# Patient Record
Sex: Male | Born: 1980 | Race: White | Hispanic: No | Marital: Single | Smoking: Current every day smoker
Health system: Southern US, Community
[De-identification: ages and names within clinical notes are randomized; demographics above are authoritative.]

## PROBLEM LIST (undated history)

## (undated) DIAGNOSIS — Z87438 Personal history of other diseases of male genital organs: Secondary | ICD-10-CM

## (undated) DIAGNOSIS — N289 Disorder of kidney and ureter, unspecified: Secondary | ICD-10-CM

## (undated) HISTORY — PX: HERNIA REPAIR: SHX51

## (undated) HISTORY — PX: CHOLECYSTECTOMY: SHX55

## (undated) HISTORY — PX: HAND SURGERY: SHX662

## (undated) HISTORY — DX: Personal history of other diseases of male genital organs: Z87.438

---

## 2018-01-19 ENCOUNTER — Ambulatory Visit: Payer: Self-pay | Admitting: Family Medicine

## 2019-03-04 ENCOUNTER — Emergency Department (HOSPITAL_COMMUNITY)
Admission: EM | Admit: 2019-03-04 | Discharge: 2019-03-05 | Disposition: A | Discharge status: Home / Self Care | Payer: BC Managed Care – PPO | Attending: Emergency Medicine | Admitting: Emergency Medicine

## 2019-03-04 ENCOUNTER — Encounter (HOSPITAL_COMMUNITY): Payer: Self-pay

## 2019-03-04 ENCOUNTER — Other Ambulatory Visit: Payer: Self-pay

## 2019-03-04 DIAGNOSIS — Z1159 Encounter for screening for other viral diseases: Secondary | ICD-10-CM | POA: Diagnosis not present

## 2019-03-04 DIAGNOSIS — R2243 Localized swelling, mass and lump, lower limb, bilateral: Secondary | ICD-10-CM | POA: Insufficient documentation

## 2019-03-04 DIAGNOSIS — R600 Localized edema: Secondary | ICD-10-CM

## 2019-03-04 DIAGNOSIS — R109 Unspecified abdominal pain: Secondary | ICD-10-CM | POA: Insufficient documentation

## 2019-03-04 DIAGNOSIS — R21 Rash and other nonspecific skin eruption: Secondary | ICD-10-CM | POA: Insufficient documentation

## 2019-03-04 LAB — CBC
HCT: 41.1 % (ref 39.0–52.0)
Hemoglobin: 13.9 g/dL (ref 13.0–17.0)
MCH: 31.6 pg (ref 26.0–34.0)
MCHC: 33.8 g/dL (ref 30.0–36.0)
MCV: 93.4 fL (ref 80.0–100.0)
Platelets: 268 10*3/uL (ref 150–400)
RBC: 4.4 MIL/uL (ref 4.22–5.81)
RDW: 12.3 % (ref 11.5–15.5)
WBC: 8.1 10*3/uL (ref 4.0–10.5)
nRBC: 0 % (ref 0.0–0.2)

## 2019-03-04 LAB — COMPREHENSIVE METABOLIC PANEL
ALT: 45 U/L — ABNORMAL HIGH (ref 0–44)
AST: 31 U/L (ref 15–41)
Albumin: 4.1 g/dL (ref 3.5–5.0)
Alkaline Phosphatase: 68 U/L (ref 38–126)
Anion gap: 10 (ref 5–15)
BUN: 16 mg/dL (ref 6–20)
CO2: 28 mmol/L (ref 22–32)
Calcium: 9.3 mg/dL (ref 8.9–10.3)
Chloride: 100 mmol/L (ref 98–111)
Creatinine, Ser: 1.19 mg/dL (ref 0.61–1.24)
GFR calc Af Amer: >60 mL/min (ref >60)
GFR calc non Af Amer: >60 mL/min (ref >60)
Glucose, Bld: 95 mg/dL (ref 70–99)
Potassium: 4.2 mmol/L (ref 3.5–5.1)
Sodium: 138 mmol/L (ref 135–145)
Total Bilirubin: 0.5 mg/dL (ref 0.3–1.2)
Total Protein: 6.6 g/dL (ref 6.5–8.1)

## 2019-03-04 LAB — URINALYSIS, ROUTINE W REFLEX MICROSCOPIC
Bilirubin Urine: NEGATIVE
Glucose, UA: NEGATIVE mg/dL
Hgb urine dipstick: NEGATIVE
Ketones, ur: NEGATIVE mg/dL
Leukocytes,Ua: NEGATIVE
Nitrite: NEGATIVE
Protein, ur: NEGATIVE mg/dL
Specific Gravity, Urine: 1.025 (ref 1.005–1.030)
pH: 6 (ref 5.0–8.0)

## 2019-03-04 LAB — LIPASE, BLOOD: Lipase: 30 U/L (ref 11–51)

## 2019-03-04 MED ORDER — SODIUM CHLORIDE 0.9% FLUSH: 3.0000 mL | Freq: Once | INTRAVENOUS | Status: DC

## 2019-03-04 MED ORDER — ONDANSETRON HCL 4 MG/2ML IJ SOLN
4.0000 mg | Freq: Once | INTRAMUSCULAR | Status: AC
  Administered 2019-03-05: 4 mg via INTRAVENOUS
  Filled 2019-03-04: qty 2

## 2019-03-04 NOTE — ED Triage Notes (Signed)
Pt reports bilateral leg swelling, subjective fevers and abd pain for the past 2 days. Nausea no vomiting. Pt anxious in triage

## 2019-03-04 NOTE — ED Provider Notes (Signed)
[facility] EMERGENCY DEPARTMENT Provider Note   CSN: [id number] Arrival date & time: 03/04/19  2114    History   Chief Complaint Chief Complaint  Patient presents with  . Abdominal Pain  . Leg Swelling  . Fever    HPI Taylor Ryan is a 38 y.o. male.     HPI  This is a 38 year old male with no significant past medical history who presents with 1 to 2-day history of lower extremity swelling, redness, chills, and abdominal pain.  Patient states that he first noted bilateral lower extremity swelling and discomfort of the bilateral lower extremities.  He has noted red to purple discoloration that sometimes improves with elevation.  He reports chills without documented fevers.  Denies any cough, shortness of breath, chest pain.  Denies any known exposures.  He has reported some abdominal discomfort.  Currently he is without abdominal pain.  He states that it felt bloated and pressure-like when he did have pain.  He reports normal bowel movements.  He endorses nausea without vomiting.  No known COVID risk factors.  History reviewed. No pertinent past medical history.  There are no active problems to display for this patient.   Past Surgical History:  Procedure Laterality Date  . CHOLECYSTECTOMY    . HERNIA REPAIR          Home Medications    Prior to Admission medications   Medication Sig Start Date End Date Taking? Authorizing Provider  doxycycline (VIBRAMYCIN) 100 MG capsule Take 1 capsule (100 mg total) by mouth 2 (two) times daily. 03/05/19   Madesyn Ast, Mayer Masker, MD    Family History No family history on file.  Social History Social History   Tobacco Use  . Smoking status: Not on file  Substance Use Topics  . Alcohol use: Not on file  . Drug use: Not on file     Allergies   Amoxicillin   Review of Systems Review of Systems  Constitutional: Positive for chills. Negative for fever.  Respiratory: Negative for cough and shortness of  breath.   Cardiovascular: Negative for chest pain.  Gastrointestinal: Positive for abdominal pain and nausea. Negative for diarrhea and vomiting.  Musculoskeletal: Negative for back pain.  Skin: Positive for color change. Negative for wound.  All other systems reviewed and are negative.    Physical Exam Updated Vital Signs BP 112/65   Pulse 86   Temp 98.7 F (37.1 C) (Oral)   Resp 18   SpO2 95%   Physical Exam Vitals signs and nursing note reviewed.  Constitutional:      General: He is not in acute distress.    Appearance: He is well-developed. He is not ill-appearing.  HENT:     Head: Normocephalic and atraumatic.  Neck:     Musculoskeletal: Neck supple.  Cardiovascular:     Rate and Rhythm: Normal rate and regular rhythm.     Heart sounds: Normal heart sounds. No murmur.  Pulmonary:     Effort: Pulmonary effort is normal. No respiratory distress.     Breath sounds: Normal breath sounds. No wheezing.  Abdominal:     General: Bowel sounds are normal.     Palpations: Abdomen is soft.     Tenderness: There is no abdominal tenderness. There is no rebound.  Lymphadenopathy:     Cervical: No cervical adenopathy.  Skin:    General: Skin is warm and dry.     Comments: Blanching erythema to the bilateral lower extremities to the  mid calf, warm to touch, scattered petechiae that is nonblanching, 2+ DP pulse laterally  Neurological:     Mental Status: He is alert and oriented to person, place, and time.  Psychiatric:        Mood and Affect: Mood normal.      ED Treatments / Results  Labs (all labs ordered are listed, but only abnormal results are displayed) Labs Reviewed  COMPREHENSIVE METABOLIC PANEL - Abnormal; Notable for the following components:      Result Value   ALT 45 (*)    All other components within normal limits  SARS CORONAVIRUS 2 (HOSPITAL ORDER, PERFORMED IN [facility])  LIPASE, BLOOD  CBC  URINALYSIS, ROUTINE W REFLEX MICROSCOPIC   PROTIME-INR    EKG None  Radiology No results found.  Procedures Procedures (including critical care time)  Medications Ordered in ED Medications  sodium chloride flush (NS) 0.9 % injection 3 mL (3 mLs Intravenous Not Given 03/05/19 0037)  ondansetron (ZOFRAN) injection 4 mg (4 mg Intravenous Given 03/05/19 0000)     Initial Impression / Assessment and Plan / ED Course  I have reviewed the triage vital signs and the nursing notes.  Pertinent labs & imaging results that were available during my care of the patient were reviewed by me and considered in my medical decision making (see chart for details).        Patient presents with lower extremity swelling, erythema and abdominal pain that has resolved.  He is overall nontoxic and afebrile on exam.  Vital signs are reassuring.  He has erythema and warmth of the bilateral lower extremities with scattered petechiae.  It does not involve the palms and the soles.  He reports purple discoloration of his extremities.  He has strong peripheral pulses which would argue against ischemic etiology.  His lab work-up is reassuring including CBC, CMP, lipase, urinalysis.  No metabolic derangements.  I did add on a COVID-19 as this has been known to have some vasculitic and prothrombotic component.  I did discuss with the patient any possible tick exposures which he denies but does have a dog and [place].  COVID is neg. patient has remained clinically stable.  Will treat for possible tickborne illness versus cellulitis.  He was given strict return precautions.  He has not had any recurrence of his abdominal discomfort.  Taylor LongsJoseph Dever was evaluated in Emergency Department on 03/05/2019 for the symptoms described in the history of present illness. He was evaluated in the context of the global COVID-19 pandemic, which necessitated consideration that the patient might be at risk for infection with the SARS-CoV-2 virus that  causes COVID-19. Institutional protocols and algorithms that pertain to the evaluation of patients at risk for COVID-19 are in a state of rapid change based on information released by regulatory bodies including the CDC and federal and state organizations. These policies and algorithms were followed during the patient's care in the ED.   Final Clinical Impressions(s) / ED Diagnoses   Final diagnoses:  Lower extremity edema  Rash    ED Discharge Orders         Ordered    doxycycline (VIBRAMYCIN) 100 MG capsule  2 times daily     03/05/19 0133           Deklin Bieler, Mayer Maskerourtney F, MD 03/05/19 [phone]

## 2019-03-05 LAB — PROTIME-INR
INR: 1 (ref 0.8–1.2)
Prothrombin Time: 13.3 seconds (ref 11.4–15.2)

## 2019-03-05 LAB — SARS CORONAVIRUS 2 BY RT PCR (HOSPITAL ORDER, PERFORMED IN ~~LOC~~ HOSPITAL LAB): SARS Coronavirus 2: NEGATIVE

## 2019-03-05 MED ORDER — DOXYCYCLINE HYCLATE 100 MG PO CAPS: 100.0000 mg | ORAL_CAPSULE | Freq: Two times a day (BID) | ORAL | 0 refills | Status: DC

## 2019-03-05 NOTE — Discharge Instructions (Addendum)
You were seen today for lower extremity swelling and redness.  Your work-up is largely reassuring.  The exact cause of your symptoms is unknown at this time.  However, you will be treated for possible skin infection.  If you have progression of redness, worsening swelling, or note increased rash, you need to be reevaluated immediately.

## 2019-03-13 ENCOUNTER — Ambulatory Visit (INDEPENDENT_AMBULATORY_CARE_PROVIDER_SITE_OTHER): Payer: BC Managed Care – PPO | Admitting: Family Medicine

## 2019-03-13 ENCOUNTER — Other Ambulatory Visit: Payer: Self-pay

## 2019-03-13 ENCOUNTER — Encounter: Payer: Self-pay | Admitting: Family Medicine

## 2019-03-13 VITALS — Wt 190.0 lb

## 2019-03-13 DIAGNOSIS — R208 Other disturbances of skin sensation: Secondary | ICD-10-CM | POA: Diagnosis not present

## 2019-03-13 DIAGNOSIS — R202 Paresthesia of skin: Secondary | ICD-10-CM

## 2019-03-13 DIAGNOSIS — Z7689 Persons encountering health services in other specified circumstances: Secondary | ICD-10-CM | POA: Diagnosis not present

## 2019-03-13 DIAGNOSIS — L819 Disorder of pigmentation, unspecified: Secondary | ICD-10-CM

## 2019-03-13 DIAGNOSIS — R740 Nonspecific elevation of levels of transaminase and lactic acid dehydrogenase [LDH]: Secondary | ICD-10-CM

## 2019-03-13 DIAGNOSIS — M791 Myalgia, unspecified site: Secondary | ICD-10-CM

## 2019-03-13 DIAGNOSIS — Z8349 Family history of other endocrine, nutritional and metabolic diseases: Secondary | ICD-10-CM | POA: Insufficient documentation

## 2019-03-13 DIAGNOSIS — Z87438 Personal history of other diseases of male genital organs: Secondary | ICD-10-CM | POA: Insufficient documentation

## 2019-03-13 DIAGNOSIS — R600 Localized edema: Secondary | ICD-10-CM

## 2019-03-13 DIAGNOSIS — Z7251 High risk heterosexual behavior: Secondary | ICD-10-CM | POA: Insufficient documentation

## 2019-03-13 DIAGNOSIS — R7401 Elevation of levels of liver transaminase levels: Secondary | ICD-10-CM

## 2019-03-13 NOTE — Progress Notes (Signed)
Subjective:   Documentation for virtual audio and video telecommunications through [employer] encounter:  The patient was located at home. 2 patient identifiers used.  The provider was located in the office. The patient did consent to this visit and is aware of possible charges through their insurance for this visit.  The other persons participating in this telemedicine service were none.     Patient ID: Taylor Ryan, male    DOB: [birth year], 38 y.o.   MRN: [id number]  HPI Chief Complaint  Patient presents with  . new pt    new pt established, legs- turning purple/blue near feet and then turn red up legs and inflammed. feel hot, tingling, no other concerns   He is a new patient. I am seeing him today virtually due to recent ED visit for suspicious Covid-19 symptoms.  Previous medical care: [facility] [place]  Complains of an 11-12 day history of an acute onset of bilateral LE edema, red-purplish discoloration of feet, ankles and lower legs almost up to his knees. States he also has tingling, burning and pain when he is on his feet. Pain resolves with rest. States when he elevates his legs, the color improves and swelling also improves but not back to baseline. Denies numbness or weakness.  Denies rash to palms or soles of feet. No other arthralgias or myalgias.   Denies unilateral calf pain or swelling. No history of DVT or PE.   He was seen in the ED for these symptoms on 03/04/19 and was treated with prophylactic Doxycycline for possible tickborne illness. States he is still taking this. Denies seeing a tick or possible bite but he is outdoors often and has a dog.   ED checked las and they were unremarkable. He did have a mildly elevated ALT.   Denies fever, chills, dizziness, headache, neck pain or stiffness, sore throat, URI symptoms, chest pain, palpitations, shortness of breath, cough, orthopnea, abdominal pain, N/V/D, urinary symptoms.   Denies family history of lupus, RA.  Sister has thyroid disease.   States he has issues with anxiety - takes Klonapin prn, states not often   Smoker- 1 ppd since age 38 Rarely drinks alcohol Smokes marijuana occasionally   Lives with boyfriend Working as a Runner, broadcasting/film/videoteacher. Teaches biology. [employer]   CBC Latest Ref Rng & Units 03/04/2019  WBC 4.0 - 10.5 K/uL 8.1  Hemoglobin 13.0 - 17.0 g/dL 40.913.9  Hematocrit 81.139.0 - 52.0 % 41.1  Platelets 150 - 400 K/uL 268   CMP Latest Ref Rng & Units 03/04/2019  Glucose 70 - 99 mg/dL 95  BUN 6 - 20 mg/dL 16  Creatinine 9.140.61 - 7.821.24 mg/dL 9.561.19  Sodium 213135 - 086145 mmol/L 138  Potassium 3.5 - 5.1 mmol/L 4.2  Chloride 98 - 111 mmol/L 100  CO2 22 - 32 mmol/L 28  Calcium 8.9 - 10.3 mg/dL 9.3  Total Protein 6.5 - 8.1 g/dL 6.6  Total Bilirubin 0.3 - 1.2 mg/dL 0.5  Alkaline Phos 38 - 126 U/L 68  AST 15 - 41 U/L 31  ALT 0 - 44 U/L 45(H)   Reviewed allergies, medications, past medical, surgical, family, and social history.   Review of Systems Pertinent positives and negatives in the history of present illness.     Objective:   Physical Exam Wt 190 lb (86.2 kg)   BMI 27.26 kg/m   Alert and oriented and in no acute distress.  Speaking in complete sentences without difficulty.  Respirations are unlabored.  Attempted to visualize lower extremities however this was difficult to do with the patient's self on but I was able to visualize edema to bilateral feet along with erythema.  Normal speech, mood and thought process.      Assessment & Plan:  Bilateral leg edema - Plan: CBC with Differential/Platelet, Comprehensive metabolic panel, Sedimentation rate, TSH, Lupus anticoagulant panel, C-reactive protein  Discoloration of skin of foot - Plan: CBC with Differential/Platelet, Comprehensive metabolic panel, RPR, Sedimentation rate, TSH, Lupus anticoagulant panel, C-reactive protein  Discoloration of skin of lower leg - Plan: RPR, Sedimentation rate, TSH, Lupus anticoagulant  panel, C-reactive protein  Encounter to establish care  Burning sensation of feet - Plan: RPR, Sedimentation rate, HIV Antibody (routine testing w rflx), Lupus anticoagulant panel, C-reactive protein  Elevated ALT measurement - Plan: Comprehensive metabolic panel, Acute Hep Panel & Hep B Surface Ab  Family history of thyroid disease in sister - Plan: TSH  Tingling of both feet - Plan: Vitamin B12, HIV Antibody (routine testing w rflx), Lupus anticoagulant panel, C-reactive protein  High risk sexual behavior, unspecified type - Plan: RPR, HIV Antibody (routine testing w rflx)  Myalgia - Plan: Sedimentation rate, Lupus anticoagulant panel, C-reactive protein, CK  Discussed limitations of virtual visit. He is new to me and we spent a significant amount of time discussing PMH, SH, family history, social history as well as his chief complaint of lower extremity edema and discoloration. I reviewed recent ED notes including lab results.  He was COVID-19 negative. I also reviewed previous PCP notes. He does not appear to be in any acute distress.  Discussed possible etiologies. Unlikely that this is cellulitis since it resolves somewhat when he elevates his legs.  Also unlikely this is a DVT or ischemia since this is bilateral and per ED exam he had strong peripheral pulses.  Will check labs to look for underlying collagen/vascular anomalies.  He will complete the doxycycline that was prescribed by the ED physician. He does report high risk sexual behavior-MSM We will check labs and follow-up. He will come into the office tomorrow for labs and vitals.  We did discuss that I am not interested in prescribing Klonopin for his anxiety and that I recommend counseling. He is aware.  Answered all of his questions and he is pleased with plan of care  Time spent on call was 30 minutes and in review of previous records 5 minutes total.  This virtual service is not related to other E/M service within  previous 7 days.

## 2019-03-14 ENCOUNTER — Other Ambulatory Visit: Payer: BC Managed Care – PPO

## 2019-03-14 DIAGNOSIS — Z7251 High risk heterosexual behavior: Secondary | ICD-10-CM

## 2019-03-14 DIAGNOSIS — Z8349 Family history of other endocrine, nutritional and metabolic diseases: Secondary | ICD-10-CM

## 2019-03-14 DIAGNOSIS — R208 Other disturbances of skin sensation: Secondary | ICD-10-CM

## 2019-03-14 DIAGNOSIS — M791 Myalgia, unspecified site: Secondary | ICD-10-CM

## 2019-03-14 DIAGNOSIS — R600 Localized edema: Secondary | ICD-10-CM

## 2019-03-14 DIAGNOSIS — R7401 Elevation of levels of liver transaminase levels: Secondary | ICD-10-CM

## 2019-03-14 DIAGNOSIS — L819 Disorder of pigmentation, unspecified: Secondary | ICD-10-CM

## 2019-03-14 DIAGNOSIS — R202 Paresthesia of skin: Secondary | ICD-10-CM

## 2019-03-15 LAB — CBC WITH DIFFERENTIAL/PLATELET
Basophils Absolute: 0.1 10*3/uL (ref 0.0–0.2)
Basos: 1 %
EOS (ABSOLUTE): 0.3 10*3/uL (ref 0.0–0.4)
Eos: 4 %
Hematocrit: 41.4 % (ref 37.5–51.0)
Hemoglobin: 14.3 g/dL (ref 13.0–17.7)
Immature Grans (Abs): 0 10*3/uL (ref 0.0–0.1)
Immature Granulocytes: 0 %
Lymphocytes Absolute: 2.4 10*3/uL (ref 0.7–3.1)
Lymphs: 37 %
MCH: 32.1 pg (ref 26.6–33.0)
MCHC: 34.5 g/dL (ref 31.5–35.7)
MCV: 93 fL (ref 79–97)
Monocytes Absolute: 0.5 10*3/uL (ref 0.1–0.9)
Monocytes: 8 %
Neutrophils Absolute: 3.2 10*3/uL (ref 1.4–7.0)
Neutrophils: 50 %
Platelets: 256 10*3/uL (ref 150–450)
RBC: 4.45 x10E6/uL (ref 4.14–5.80)
RDW: 12.9 % (ref 11.6–15.4)
WBC: 6.5 10*3/uL (ref 3.4–10.8)

## 2019-03-15 LAB — COMPREHENSIVE METABOLIC PANEL
ALT: 24 IU/L (ref 0–44)
AST: 19 IU/L (ref 0–40)
Albumin/Globulin Ratio: 2.2 (ref 1.2–2.2)
Albumin: 4.4 g/dL (ref 4.0–5.0)
Alkaline Phosphatase: 61 IU/L (ref 39–117)
BUN/Creatinine Ratio: 15 (ref 9–20)
BUN: 15 mg/dL (ref 6–20)
Bilirubin Total: 0.5 mg/dL (ref 0.0–1.2)
CO2: 24 mmol/L (ref 20–29)
Calcium: 9.3 mg/dL (ref 8.7–10.2)
Chloride: 101 mmol/L (ref 96–106)
Creatinine, Ser: 0.98 mg/dL (ref 0.76–1.27)
GFR calc Af Amer: 112 mL/min/{1.73_m2} (ref >59)
GFR calc non Af Amer: 97 mL/min/{1.73_m2} (ref >59)
Globulin, Total: 2 g/dL (ref 1.5–4.5)
Glucose: 75 mg/dL (ref 65–99)
Potassium: 4.1 mmol/L (ref 3.5–5.2)
Sodium: 140 mmol/L (ref 134–144)
Total Protein: 6.4 g/dL (ref 6.0–8.5)

## 2019-03-15 LAB — LUPUS ANTICOAGULANT PANEL
Dilute Viper Venom Time: 32.7 s (ref 0.0–47.0)
PTT Lupus Anticoagulant: 34 s (ref 0.0–51.9)

## 2019-03-15 LAB — SEDIMENTATION RATE: Sed Rate: 2 mm/hr (ref 0–15)

## 2019-03-15 LAB — ACUTE HEP PANEL AND HEP B SURFACE AB
Hep A IgM: NEGATIVE
Hep B C IgM: NEGATIVE
Hep C Virus Ab: <0.1 s/co ratio (ref 0.0–0.9)
Hepatitis B Surf Ab Quant: <3.1 m[IU]/mL — ABNORMAL LOW (ref >9.9)
Hepatitis B Surface Ag: NEGATIVE

## 2019-03-15 LAB — RPR: RPR Ser Ql: NONREACTIVE

## 2019-03-15 LAB — C-REACTIVE PROTEIN: CRP: <1 mg/L (ref 0–10)

## 2019-03-15 LAB — TSH: TSH: 2.99 u[IU]/mL (ref 0.450–4.500)

## 2019-03-15 LAB — CK: Total CK: 108 U/L (ref 49–439)

## 2019-03-15 LAB — VITAMIN B12: Vitamin B-12: 502 pg/mL (ref 232–1245)

## 2019-03-15 LAB — HIV ANTIBODY (ROUTINE TESTING W REFLEX): HIV Screen 4th Generation wRfx: NONREACTIVE

## 2019-03-23 ENCOUNTER — Encounter: Payer: Self-pay | Admitting: Family Medicine

## 2019-03-26 ENCOUNTER — Telehealth: Payer: Self-pay | Admitting: Family Medicine

## 2019-03-26 NOTE — Telephone Encounter (Signed)
Please call pt , per caller ? Tim (show was not the pt) The patients symptoms have progressed And "he is too stubborn to call"   Did advise caller that we would have to speak to pt  ( No DPR in system)

## 2019-03-26 NOTE — Telephone Encounter (Signed)
Please call and get more information. Thanks.

## 2019-03-26 NOTE — Telephone Encounter (Signed)
Tried to call pt but number not going through. I sent pt a mychart message

## 2019-03-27 NOTE — Telephone Encounter (Signed)
Tried to call pt but unable to reach him.

## 2019-03-28 NOTE — Telephone Encounter (Signed)
Phone number listed for pt will not go through. Ive sent him a Clinical cytogeneticist message to reply

## 2019-07-30 ENCOUNTER — Encounter (HOSPITAL_COMMUNITY): Payer: Self-pay

## 2019-07-30 ENCOUNTER — Other Ambulatory Visit: Payer: Self-pay

## 2019-07-30 ENCOUNTER — Emergency Department (HOSPITAL_COMMUNITY)
Admission: EM | Admit: 2019-07-30 | Discharge: 2019-07-30 | Disposition: A | Discharge status: Home / Self Care | Payer: BC Managed Care – PPO | Attending: Emergency Medicine | Admitting: Emergency Medicine

## 2019-07-30 ENCOUNTER — Emergency Department (HOSPITAL_COMMUNITY): Payer: BC Managed Care – PPO

## 2019-07-30 DIAGNOSIS — F17210 Nicotine dependence, cigarettes, uncomplicated: Secondary | ICD-10-CM | POA: Insufficient documentation

## 2019-07-30 DIAGNOSIS — N5089 Other specified disorders of the male genital organs: Secondary | ICD-10-CM | POA: Diagnosis not present

## 2019-07-30 DIAGNOSIS — N50812 Left testicular pain: Secondary | ICD-10-CM

## 2019-07-30 DIAGNOSIS — R109 Unspecified abdominal pain: Secondary | ICD-10-CM | POA: Insufficient documentation

## 2019-07-30 HISTORY — DX: Disorder of kidney and ureter, unspecified: N28.9

## 2019-07-30 LAB — URINALYSIS, ROUTINE W REFLEX MICROSCOPIC
Bacteria, UA: NONE SEEN
Bilirubin Urine: NEGATIVE
Glucose, UA: NEGATIVE mg/dL
Ketones, ur: NEGATIVE mg/dL
Leukocytes,Ua: NEGATIVE
Nitrite: NEGATIVE
Protein, ur: NEGATIVE mg/dL
Specific Gravity, Urine: 1.016 (ref 1.005–1.030)
pH: 6 (ref 5.0–8.0)

## 2019-07-30 LAB — CBC WITH DIFFERENTIAL/PLATELET
Abs Immature Granulocytes: 0.06 10*3/uL (ref 0.00–0.07)
Basophils Absolute: 0.1 10*3/uL (ref 0.0–0.1)
Basophils Relative: 1 %
Eosinophils Absolute: 0.1 10*3/uL (ref 0.0–0.5)
Eosinophils Relative: 1 %
HCT: 45.7 % (ref 39.0–52.0)
Hemoglobin: 15.4 g/dL (ref 13.0–17.0)
Immature Granulocytes: 1 %
Lymphocytes Relative: 19 %
Lymphs Abs: 2.4 10*3/uL (ref 0.7–4.0)
MCH: 31.5 pg (ref 26.0–34.0)
MCHC: 33.7 g/dL (ref 30.0–36.0)
MCV: 93.5 fL (ref 80.0–100.0)
Monocytes Absolute: 0.8 10*3/uL (ref 0.1–1.0)
Monocytes Relative: 6 %
Neutro Abs: 9.3 10*3/uL — ABNORMAL HIGH (ref 1.7–7.7)
Neutrophils Relative %: 72 %
Platelets: 229 10*3/uL (ref 150–400)
RBC: 4.89 MIL/uL (ref 4.22–5.81)
RDW: 12.3 % (ref 11.5–15.5)
WBC: 12.8 10*3/uL — ABNORMAL HIGH (ref 4.0–10.5)
nRBC: 0 % (ref 0.0–0.2)

## 2019-07-30 LAB — LIPASE, BLOOD: Lipase: 20 U/L (ref 11–51)

## 2019-07-30 LAB — COMPREHENSIVE METABOLIC PANEL
ALT: 68 U/L — ABNORMAL HIGH (ref 0–44)
AST: 25 U/L (ref 15–41)
Albumin: 4.3 g/dL (ref 3.5–5.0)
Alkaline Phosphatase: 72 U/L (ref 38–126)
Anion gap: 7 (ref 5–15)
BUN: 8 mg/dL (ref 6–20)
CO2: 26 mmol/L (ref 22–32)
Calcium: 8.8 mg/dL — ABNORMAL LOW (ref 8.9–10.3)
Chloride: 103 mmol/L (ref 98–111)
Creatinine, Ser: 0.99 mg/dL (ref 0.61–1.24)
GFR calc Af Amer: >60 mL/min (ref >60)
GFR calc non Af Amer: >60 mL/min (ref >60)
Glucose, Bld: 96 mg/dL (ref 70–99)
Potassium: 3.9 mmol/L (ref 3.5–5.1)
Sodium: 136 mmol/L (ref 135–145)
Total Bilirubin: 0.9 mg/dL (ref 0.3–1.2)
Total Protein: 6.9 g/dL (ref 6.5–8.1)

## 2019-07-30 MED ORDER — KETOROLAC TROMETHAMINE 30 MG/ML IJ SOLN
30.0000 mg | Freq: Once | INTRAMUSCULAR | Status: AC
  Administered 2019-07-30: 12:00:00 30 mg via INTRAMUSCULAR
  Filled 2019-07-30: qty 1

## 2019-07-30 NOTE — Discharge Instructions (Signed)
Your testicular ultrasound showed a mass of some type today - please follow up with [facility]. They are aware of you and are trying to schedule an appointment as soon as possible. I recommend calling them to ensure close follow up.  Your CT scan did not show any signs of kidney stone today. The remainder of your blood work was reassuring.  Take 800 mg Ibuprofen every 8 hours as needed for the pain Please follow up with your PCP as well

## 2019-07-30 NOTE — ED Provider Notes (Signed)
[facility]-EMERGENCY DEPT Provider Note   CSN: [id number] Arrival date & time: 07/30/19  7829     History   Chief Complaint Chief Complaint  Patient presents with   Flank Pain    HPI Taylor Ryan is a 38 y.o. male with PMHx prostatitis who presents to the ED today complaining of gradual onset, constant, sharp, 10/10, left flank pain radiating into left testicle that began yesterday.  Patient reports history of kidney stones and states this feels similar.  He is also complaining of some difficulty voiding.  He states he has been able to void but states it is smaller amount than normal.  He has not noticed any blood in his urine.  Took some Tylenol with mild relief.  He had his kidney stone 17 years ago he was able to pass it on his own.  Patient also has a history of prostatitis but states this feels very different.  Denies fever, chills, nausea, vomiting, penile pain, penile discharge, any other associated symptoms.        Past Medical History:  Diagnosis Date   History of prostatitis    Renal disorder     Patient Active Problem List   Diagnosis Date Noted   Family history of thyroid disease in sister 03/13/2019   High risk sexual behavior 03/13/2019   Bilateral leg edema 03/13/2019   History of prostatitis     Past Surgical History:  Procedure Laterality Date   CHOLECYSTECTOMY     HAND SURGERY Right    HERNIA REPAIR          Home Medications    Prior to Admission medications   Medication Sig Start Date End Date Taking? Authorizing Provider  acetaminophen (TYLENOL) 325 MG tablet Take 650 mg by mouth every 6 (six) hours as needed for mild pain or headache.   Yes [provider]  doxycycline (VIBRAMYCIN) 100 MG capsule Take 1 capsule (100 mg total) by mouth 2 (two) times daily. Patient not taking: Reported on 07/30/2019 03/05/19   Horton, Mayer Masker, MD    Family History Family History  Problem Relation Age of Onset    Hypertension Mother    Heart failure Father    Hypertension Father    Thyroid disease Sister     Social History Social History   Tobacco Use   Smoking status: Current Every Day Smoker    Packs/day: 1.00    Years: 22.00    Pack years: 22.00    Types: Cigarettes   Smokeless tobacco: Never Used  Substance Use Topics   Alcohol use: Never    Frequency: Never   Drug use: Never     Allergies   Amoxicillin   Review of Systems Review of Systems  Constitutional: Negative for chills and fever.  HENT: Negative for congestion.   Eyes: Negative for visual disturbance.  Respiratory: Negative for cough and shortness of breath.   Cardiovascular: Negative for chest pain.  Gastrointestinal: Positive for abdominal pain. Negative for constipation, diarrhea, nausea and vomiting.  Genitourinary: Positive for difficulty urinating, flank pain and testicular pain. Negative for discharge, dysuria, frequency, penile pain, penile swelling and scrotal swelling.  Musculoskeletal: Negative for myalgias.  Skin: Negative for rash.  Neurological: Negative for headaches.     Physical Exam Updated Vital Signs BP 121/84 (BP Location: Left Arm)    Pulse 97    Temp 98.8 F (37.1 C) (Oral)    Resp 20    Ht  (1.778 m)  Wt 95.3 kg    SpO2 100%    BMI 30.13 kg/m   Physical Exam Vitals signs and nursing note reviewed.  Constitutional:      Appearance: He is ill-appearing.     Comments: Uncomfortable appearing male  HENT:     Head: Normocephalic and atraumatic.  Eyes:     Conjunctiva/sclera: Conjunctivae normal.  Neck:     Musculoskeletal: Neck supple.  Cardiovascular:     Rate and Rhythm: Normal rate and regular rhythm.     Pulses: Normal pulses.  Pulmonary:     Effort: Pulmonary effort is normal.     Breath sounds: Normal breath sounds. No wheezing or rales.  Chest:     Chest wall: No tenderness.  Abdominal:     Palpations: Abdomen is soft.     Tenderness: There is abdominal  tenderness. There is left CVA tenderness. There is no right CVA tenderness, guarding or rebound.     Comments: Soft, + LLQ tenderness to palpation, +BS throughout, no r/g/r, neg murphy's, neg mcburney's, + left CVA TTP  Genitourinary:    Comments: Chaperone present for exam Circumcised penis without phimosis/paraphimosis, hypospadias, erythema, tenderness, or discharge. No rashes or lesions. Left testis without erythema or edema; + TPP, normal cremasterics reflex present bilaterally. No abnormal lie. No inguinal hernias or adenopathy present. Skin:    General: Skin is warm and dry.  Neurological:     Mental Status: He is alert.      ED Treatments / Results  Labs (all labs ordered are listed, but only abnormal results are displayed) Labs Reviewed  URINALYSIS, ROUTINE W REFLEX MICROSCOPIC - Abnormal; Notable for the following components:      Result Value   Hgb urine dipstick MODERATE (*)    All other components within normal limits  COMPREHENSIVE METABOLIC PANEL - Abnormal; Notable for the following components:   Calcium 8.8 (*)    ALT 68 (*)    All other components within normal limits  CBC WITH DIFFERENTIAL/PLATELET - Abnormal; Notable for the following components:   WBC 12.8 (*)    Neutro Abs 9.3 (*)    All other components within normal limits  LIPASE, BLOOD    EKG None  Radiology Ct Renal Stone Study  Result Date: 07/30/2019 CLINICAL DATA:  Flank pain.  Evaluate for kidney stones. EXAM: CT ABDOMEN AND PELVIS WITHOUT CONTRAST TECHNIQUE: Multidetector CT imaging of the abdomen and pelvis was performed following the standard protocol without IV contrast. COMPARISON:  None. FINDINGS: Lower chest: No acute abnormality. Hepatobiliary: No focal liver abnormality identified. Previous cholecystectomy. Increase caliber of the common bile duct is noted measuring up to 1.2 cm. Pancreas: Unremarkable. No pancreatic ductal dilatation or surrounding inflammatory changes. Spleen: Normal in  size without focal abnormality. Adrenals/Urinary Tract: Normal adrenal glands. No kidney stones identified bilaterally. No hydronephrosis or hydroureter. No ureteral calculi. Urinary bladder appears normal. Stomach/Bowel: Stomach appears normal. The small bowel loops have a normal course and caliber. The appendix is visualized and appears normal. A moderate stool burden is noted throughout the colon. No abnormal bowel wall thickening, inflammation or distension. Vascular/Lymphatic: Aortic atherosclerosis. No aneurysm. No abdominopelvic adenopathy identified. Reproductive: Prostate is unremarkable. Other: Bilateral inguinal hernias containing fat only are identified, right greater than left. There is a small umbilical hernia which contains fat only, image 50/2. Previous herniorrhaphy within the supraumbilical, midline ventral abdominal wall noted. Laxity of the hernia mesh is identified along the undersurface of the ventral abdominal wall. Musculoskeletal: No acute or  significant osseous findings. IMPRESSION: 1. No acute findings. No evidence for nephrolithiasis or hydronephrosis. 2. Moderate stool burden identified within the colon 3. Bilateral fat contained inguinal hernias and fat containing umbilical hernia. Electronically Signed   By: Signa Kellaylor  Stroud M.D.   On: 07/30/2019 11:57   [place] Scrotum W/doppler  Result Date: 07/30/2019 CLINICAL DATA:  Left testicular pain. EXAM: SCROTAL ULTRASOUND DOPPLER ULTRASOUND OF THE TESTICLES TECHNIQUE: Complete ultrasound examination of the testicles, epididymis, and other scrotal structures was performed. Color and spectral Doppler ultrasound were also utilized to evaluate blood flow to the testicles. COMPARISON:  None. FINDINGS: Right testicle Measurements: 4.1 x 3.0 x 1.9 cm. No mass or microlithiasis visualized. Left testicle Measurements: 4.5 x 2.9 x 2.7 cm. 2.5 x 2.2 x 2.0 cm heterogeneous abnormality is noted in the left testicle concerning for neoplasm or malignancy.  Right epididymis:  Normal in size and appearance. Left epididymis: Left epididymis is mildly enlarged with probable 5 mm cyst. Hydrocele:  Small left hydrocele is noted. Varicocele:  None visualized. Pulsed Doppler interrogation of both testes demonstrates normal low resistance arterial and venous waveforms bilaterally. IMPRESSION: 2.5 cm heterogeneous abnormality is noted in the left testicle concerning for possible neoplasm or malignancy. Consultation with urology is recommended. Mild left epididymal enlargement is noted of uncertain etiology, 5 mm left epididymal cyst is noted. Small left hydrocele is noted. There is no Doppler evidence of testicular torsion. Electronically Signed   By: Lupita RaiderJames  Green Jr M.D.   On: 07/30/2019 12:27    Procedures Procedures (including critical care time)  Medications Ordered in ED Medications  ketorolac (TORADOL) 30 MG/ML injection 30 mg (30 mg Intramuscular Given 07/30/19 1151)     Initial Impression / Assessment and Plan / ED Course  I have reviewed the triage vital signs and the nursing notes.  Pertinent labs & imaging results that were available during my care of the patient were reviewed by me and considered in my medical decision making (see chart for details).  Clinical Course as of Jul 29 1399  Tue Jul 30, 2019  1241 Discussed case with Dr. Berneice HeinrichManny with urology - reccs outpatient F/U.    [MV]    Clinical Course User Index [MV] Tanda RockersVenter, Tomia Enlow, PA-C   38 year old male who presents the ED complaining of left flank pain radiating into her left lower quadrant and left testes.  History of kidney stones and states this feels similar.  Patient appears very uncomfortable on exam is writhing in bed with what appears to be colicky type pain.  His vital signs are stable today.  Patient is afebrile without tachycardia or tachypnea.  He does have left CVA tenderness on exam as well as left lower quadrant tenderness and left testicular tenderness.  Will obtain  baseline blood work and obtain CT renal stone study.Testicular ultrasound ordered as well to rule out torsion given significant TTP to left testicle. Toradol given for pain.  Bladder scan with 150 mLs   Leukocytosis present at 12.8. No electrolyte abnormalities today. Lipase negative. CT renal study negative for kidney stones. Does show some fat containing hernias but no evidence of incarceration or strangulation.   Testicular ultrasound with concern for malignancy. Will consult Dr. Berneice Heinrichmanny with urology for further reccs   Pt to follow up with urology. They are attempting to get him an appt ASAP but recommend he call as well to ensure follow up. Patient may be having pain from potential small bleed from the mass in his testicle.   Upon  reevaluation patient resting more comfortably than when he entered the ED. I have discussed findings of ultrasound with him and answered all of his questions. He understands that he needs to follow up with urology for further management/potential surgery in the near future. Still awaiting urine at this time - if no acute findings on urine will discharge home.   Urinalysis with small amount of Hgb on dipstick; none seen microscopically. No infection. Will discharge patient home at this time with close urology follow up.   This note was prepared using Dragon voice recognition software and may include unintentional dictation errors due to the inherent limitations of voice recognition software.       Final Clinical Impressions(s) / ED Diagnoses   Final diagnoses:  Pain in left testicle  Testicular mass    ED Discharge Orders    None       Tanda Rockers, PA-C 07/30/19 1401    Milagros Loll, MD 07/31/19 [phone]

## 2019-07-30 NOTE — ED Triage Notes (Signed)
Patient c/o left flank pain since yesterday AM. Patient states when he stands it feels like someone is "yanking on his testicles." Patient states he is voiding small amounts.

## 2019-08-06 ENCOUNTER — Other Ambulatory Visit: Payer: Self-pay

## 2019-08-06 ENCOUNTER — Inpatient Hospital Stay: Payer: BC Managed Care – PPO | Admitting: Family Medicine

## 2019-11-25 ENCOUNTER — Other Ambulatory Visit: Payer: Self-pay | Admitting: Urology

## 2019-11-25 DIAGNOSIS — N509 Disorder of male genital organs, unspecified: Secondary | ICD-10-CM

## 2019-11-26 ENCOUNTER — Telehealth: Payer: Self-pay | Admitting: Family Medicine

## 2019-11-26 NOTE — Telephone Encounter (Signed)
Pt dropped off FMLA paperwork to be filled out put in your folder

## 2019-11-26 NOTE — Telephone Encounter (Signed)
Please find out what this is about. I have not seen him in several months.

## 2019-11-26 NOTE — Telephone Encounter (Signed)
Pt is coming in Thursday to fill the paperwork out

## 2019-11-26 NOTE — Telephone Encounter (Signed)
Number listed is not a working number. Looks like pt is scheduled for Thursday to discuss papers

## 2019-11-28 ENCOUNTER — Ambulatory Visit
Admission: RE | Admit: 2019-11-28 | Discharge: 2019-11-28 | Disposition: A | Discharge status: Home / Self Care | Payer: BC Managed Care – PPO | Source: Ambulatory Visit | Attending: Urology | Admitting: Urology

## 2019-11-28 ENCOUNTER — Encounter: Payer: Self-pay | Admitting: Family Medicine

## 2019-11-28 ENCOUNTER — Ambulatory Visit: Payer: BC Managed Care – PPO | Admitting: Family Medicine

## 2019-11-28 ENCOUNTER — Other Ambulatory Visit: Payer: Self-pay

## 2019-11-28 VITALS — BP 120/70 | HR 81 | Temp 97.7°F | Wt 250.0 lb

## 2019-11-28 DIAGNOSIS — N5089 Other specified disorders of the male genital organs: Secondary | ICD-10-CM | POA: Diagnosis not present

## 2019-11-28 DIAGNOSIS — F1520 Other stimulant dependence, uncomplicated: Secondary | ICD-10-CM | POA: Diagnosis not present

## 2019-11-28 DIAGNOSIS — R7401 Elevation of levels of liver transaminase levels: Secondary | ICD-10-CM

## 2019-11-28 DIAGNOSIS — F419 Anxiety disorder, unspecified: Secondary | ICD-10-CM | POA: Diagnosis not present

## 2019-11-28 DIAGNOSIS — N509 Disorder of male genital organs, unspecified: Secondary | ICD-10-CM

## 2019-11-28 LAB — COMPREHENSIVE METABOLIC PANEL
ALT: 31 IU/L (ref 0–44)
AST: 24 IU/L (ref 0–40)
Albumin/Globulin Ratio: 1.9 (ref 1.2–2.2)
Albumin: 4.3 g/dL (ref 4.0–5.0)
Alkaline Phosphatase: 61 IU/L (ref 39–117)
BUN/Creatinine Ratio: 14 (ref 9–20)
BUN: 14 mg/dL (ref 6–24)
Bilirubin Total: 0.2 mg/dL (ref 0.0–1.2)
CO2: 25 mmol/L (ref 20–29)
Calcium: 9.6 mg/dL (ref 8.7–10.2)
Chloride: 104 mmol/L (ref 96–106)
Creatinine, Ser: 0.98 mg/dL (ref 0.76–1.27)
GFR calc Af Amer: 111 mL/min/{1.73_m2} (ref >59)
GFR calc non Af Amer: 96 mL/min/{1.73_m2} (ref >59)
Globulin, Total: 2.3 g/dL (ref 1.5–4.5)
Glucose: 96 mg/dL (ref 65–99)
Potassium: 5.5 mmol/L — ABNORMAL HIGH (ref 3.5–5.2)
Sodium: 142 mmol/L (ref 134–144)
Total Protein: 6.6 g/dL (ref 6.0–8.5)

## 2019-11-28 LAB — CBC WITH DIFFERENTIAL/PLATELET
Basophils Absolute: 0.1 10*3/uL (ref 0.0–0.2)
Basos: 1 %
EOS (ABSOLUTE): 0.3 10*3/uL (ref 0.0–0.4)
Eos: 3 %
Hematocrit: 42.5 % (ref 37.5–51.0)
Hemoglobin: 14.8 g/dL (ref 13.0–17.7)
Immature Grans (Abs): 0 10*3/uL (ref 0.0–0.1)
Immature Granulocytes: 0 %
Lymphocytes Absolute: 2.4 10*3/uL (ref 0.7–3.1)
Lymphs: 29 %
MCH: 32 pg (ref 26.6–33.0)
MCHC: 34.8 g/dL (ref 31.5–35.7)
MCV: 92 fL (ref 79–97)
Monocytes Absolute: 0.6 10*3/uL (ref 0.1–0.9)
Monocytes: 7 %
Neutrophils Absolute: 4.8 10*3/uL (ref 1.4–7.0)
Neutrophils: 60 %
Platelets: 268 10*3/uL (ref 150–450)
RBC: 4.63 x10E6/uL (ref 4.14–5.80)
RDW: 13.1 % (ref 11.6–15.4)
WBC: 8.2 10*3/uL (ref 3.4–10.8)
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
## 2019-11-28 NOTE — Patient Instructions (Signed)
It was a pleasure seeing you today.   Please call for outpatient help with addiction.   [facility] [address] [place] [address]

## 2019-11-29 ENCOUNTER — Other Ambulatory Visit: Payer: Self-pay | Admitting: Family Medicine

## 2019-11-29 DIAGNOSIS — E875 Hyperkalemia: Secondary | ICD-10-CM

## 2019-11-29 NOTE — Progress Notes (Signed)
Please have him come in Monday for a lab visit to recheck potassium level. I will put in the order.

## 2019-12-02 ENCOUNTER — Other Ambulatory Visit: Payer: Self-pay

## 2019-12-02 ENCOUNTER — Other Ambulatory Visit: Payer: BC Managed Care – PPO

## 2019-12-02 DIAGNOSIS — E875 Hyperkalemia: Secondary | ICD-10-CM

## 2019-12-03 LAB — BASIC METABOLIC PANEL
BUN/Creatinine Ratio: 19 (ref 9–20)
BUN: 18 mg/dL (ref 6–24)
CO2: 24 mmol/L (ref 20–29)
Calcium: 9.3 mg/dL (ref 8.7–10.2)
Chloride: 105 mmol/L (ref 96–106)
Creatinine, Ser: 0.93 mg/dL (ref 0.76–1.27)
GFR calc Af Amer: 118 mL/min/{1.73_m2} (ref >59)
GFR calc non Af Amer: 102 mL/min/{1.73_m2} (ref >59)
Glucose: 92 mg/dL (ref 65–99)
Potassium: 4.7 mmol/L (ref 3.5–5.2)
Sodium: 144 mmol/L (ref 134–144)

## 2019-12-12 ENCOUNTER — Other Ambulatory Visit: Payer: Self-pay

## 2019-12-12 ENCOUNTER — Ambulatory Visit (INDEPENDENT_AMBULATORY_CARE_PROVIDER_SITE_OTHER): Payer: BC Managed Care – PPO | Admitting: Family Medicine

## 2019-12-12 ENCOUNTER — Encounter: Payer: Self-pay | Admitting: Family Medicine

## 2019-12-12 VITALS — Temp 98.6°F | Ht 70.0 in | Wt 250.0 lb

## 2019-12-12 DIAGNOSIS — F1520 Other stimulant dependence, uncomplicated: Secondary | ICD-10-CM

## 2019-12-12 DIAGNOSIS — F419 Anxiety disorder, unspecified: Secondary | ICD-10-CM
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
## 2020-07-08 ENCOUNTER — Encounter: Payer: Self-pay | Admitting: Family Medicine

## 2020-07-27 ENCOUNTER — Encounter: Payer: Self-pay | Admitting: Family Medicine

## 2020-07-27 ENCOUNTER — Other Ambulatory Visit: Payer: Self-pay

## 2020-07-27 ENCOUNTER — Ambulatory Visit (INDEPENDENT_AMBULATORY_CARE_PROVIDER_SITE_OTHER): Payer: BC Managed Care – PPO | Admitting: Family Medicine

## 2020-07-27 VITALS — BP 120/70 | HR 73 | Wt 222.4 lb

## 2020-07-27 DIAGNOSIS — Z23 Encounter for immunization: Secondary | ICD-10-CM | POA: Diagnosis not present

## 2020-07-27 DIAGNOSIS — Z7689 Persons encountering health services in other specified circumstances: Secondary | ICD-10-CM

## 2020-07-27 DIAGNOSIS — R7989 Other specified abnormal findings of blood chemistry: Secondary | ICD-10-CM | POA: Diagnosis not present

## 2020-07-27 DIAGNOSIS — Z8349 Family history of other endocrine, nutritional and metabolic diseases: Secondary | ICD-10-CM

## 2020-07-27 DIAGNOSIS — R5383 Other fatigue: Secondary | ICD-10-CM

## 2020-07-27 DIAGNOSIS — Z7251 High risk heterosexual behavior: Secondary | ICD-10-CM

## 2020-07-27 NOTE — Patient Instructions (Signed)
We will check labs and be in touch.   Schedule a lab visit between 8 am and 10 am next week.   Emtricitabine; Tenofovir disoproxil fumarate tablets What is this medicine? EMTRICITABINE; TENOFOVIR DISOPROXIL FUMARATE (em tri SIT uh bean; te NOE fo veer) is 2 antiretroviral medicines in 1 tablet. It is used with other medicines to treat HIV. This medicine is not a cure for HIV. This medicine can lower, but not fully prevent, the risk of spreading HIV to others. This medicine can also be used with safe sex practices to prevent HIV infection in high-risk persons. This medicine may be used for other purposes; ask your health care provider or pharmacist if you have questions. COMMON BRAND NAME(S): Truvada What should I tell my health care provider before I take this medicine? They need to know if you have any of these conditions:  bone problems  kidney disease  liver disease  an unusual or allergic reaction to emtricitabine, tenofovir, other medicines, foods, dyes, or preservatives  pregnant or trying to get pregnant  breast-feeding How should I use this medicine? Take this medicine by mouth with a glass of water. Swallow tablets whole; do not cut, crush or chew. Follow the directions on the prescription label. You can take it with or without food. If it upsets your stomach, take it with food. Take your medicine at regular intervals. Do not take your medicine more often than directed. For your anti-HIV therapy to work as well as possible, take each dose exactly as prescribed. Do not skip doses or stop your medicine even if you feel better. Skipping doses may make the HIV virus resistant to this medicine and other medicines. Do not stop taking except on your doctor's advice. Talk to your pediatrician regarding the use of this medicine in children. While this drug may be prescribed for selected conditions, precautions do apply. Overdosage: If you think you have taken too much of this medicine  contact a poison control center or emergency room at once. NOTE: This medicine is only for you. Do not share this medicine with others. What if I miss a dose? If you miss a dose, take it as soon as you can. If it is almost time for your next dose, take only that dose. Do not take double or extra doses. What may interact with this medicine? Do not take this medicine with any of the following medications:  adefovir  any medicine that contains lamivudine  any medicine that contains emtricitabine or tenofovir This medicine may also interact with the following medications:  atazanavir  didanosine, ddI  lopinavir; ritonavir  medicines for viral infections like cidofovir, acyclovir, valacyclovir, ganciclovir, valganciclovir  saquinavir This list may not describe all possible interactions. Give your health care provider a list of all the medicines, herbs, non-prescription drugs, or dietary supplements you use. Also tell them if you smoke, drink alcohol, or use illegal drugs. Some items may interact with your medicine. What should I watch for while using this medicine? Visit your doctor or health care professional for regular check ups. Discuss any new symptoms with your doctor. You will need to have important blood work done while on this medicine. HIV is spread to others through sexual or blood contact. Talk to your doctor about how to stop the spread of HIV. If you have hepatitis B, talk to your doctor if you plan to stop this medicine. The symptoms of hepatitis B may get worse if you stop this medicine. What side effects  may I notice from receiving this medicine? Side effects that you should report to your doctor or health care professional as soon as possible:  allergic reactions like skin rash, itching or hives, swelling of the face, lips, or tongue  bone pain  breathing difficulties  changes in vision  dizziness  fast, irregular heartbeat  muscle pain  nausea, vomiting,  unusual upset stomach or stomach pain  signs and symptoms of kidney injury like trouble passing urine or change in the amount of urine  signs and symptoms of liver injury like dark yellow or brown urine; general ill feeling or flu-like symptoms; light-colored stools; loss of appetite; nausea; right upper belly pain; unusually weak or tired; yellowing of the eyes or skin  signs of infection - fever or chills, cough, sore throat, pain or trouble passing urine Side effects that usually do not require medical attention (report to your doctor or health care professional if they continue or are bothersome):  abnormal dreams  cough  depressed mood  diarrhea  headache  skin discoloration  trouble sleeping This list may not describe all possible side effects. Call your doctor for medical advice about side effects. You may report side effects to FDA at 1-800-FDA-1088. Where should I keep my medicine? Keep out of the reach of children. Store at room temperature between 15 and 30 degrees C (59 and 86 degrees F). Throw away any unused medicine after the expiration date. NOTE: This sheet is a summary. It may not cover all possible information. If you have questions about this medicine, talk to your doctor, pharmacist, or health care provider.  2020 Elsevier/Gold Standard (2017-08-31 11:07:40)

## 2020-07-27 NOTE — Progress Notes (Signed)
   Subjective:    Patient ID: Taylor Ryan, male    DOB: 18-Feb-1981, 39 y.o.   MRN: [id number]  HPI Chief Complaint  Patient presents with  . discuss issues    discuss prep,and testosterone recheck and get booster and flu shot   States he would like to get his testosterone checked. He brought in results of low testosterone (227) and low free testosterone (7.6) in 09/2014.  Reports some issues with decreased libido. States he has been more emotional recently. States he cries easily  States he went to [facility] in the past. Used pellets and then injections. He has never tried topical.  States his energy level is fine during the day until 6 pm but then he becomes severely tired in the evenings.   Walks his dogs 2 miles per day.   States he and his male partner occasionally have an extra sexual partner.  He is interested in staring on PrEP. He has never been on PrEP.    He is going on 10 months without using. Attends NA meetings online.   States he is still smoking.   History of prostatitis.   Denies fever, chills, dizziness, chest pain, palpitations, shortness of breath, abdominal pain, N/V/D, urinary symptoms, LE edema.     Review of Systems Pertinent positives and negatives in the history of present illness.     Objective:   Physical Exam BP 120/70   Pulse 73   Wt 222 lb 6.4 oz (100.9 kg)   BMI 31.91 kg/m   Alert and in no distress. Neck is supple without adenopathy or thyromegaly. Cardiac exam shows a regular sinus rhythm without murmurs or gallops. Lungs are clear to auscultation. Extremities without edema. Skin is warm and dry. Normal speech, mood and thought process.       Assessment & Plan:  Low serum testosterone - Plan: CBC with Differential/Platelet, Comprehensive metabolic panel, PSA, FSH/LH, Prolactin, Testosterone -hx of low serum testosterone with treatment at [facility]. He no longer is a patient there and has not been on hormone replacement in years. He is  curious if his recent mood change and decreased libido is related.   COVID-19 vaccine administered - Plan: Pfizer SARS-COV-2 Vaccine -discussed potential side effects   High risk sexual behavior, unspecified type - Plan: RPR, HIV Antibody (routine testing w rflx), Hepatitis C antibody, Hepatitis B surface antibody,qualitative, GC/Chlamydia Probe Amp, Hepatitis B surface antigen, Hepatitis B core antibody, IgM -check appropriate labs and follow up. Counseling on Truvada and [place]. Use in addition to condoms. Will need serial HIV testing every 3 months.   Family history of thyroid disease in sister - Plan: T4, free, T3, TSH -follow up pending results.   Needs flu shot - Plan: Flu Vaccine QUAD 36+ mos IM  Encounter prior to initiation of medication - Plan: PSA, Hepatitis B surface antigen, Hepatitis B core antibody, IgM, FSH/LH, Prolactin -he would like to start on PrEP  Fatigue, unspecified type - Plan: CBC with Differential/Platelet, Comprehensive metabolic panel, T4, free, T3, TSH, VITAMIN D 25 Hydroxy (Vit-D Deficiency, Fractures), Vitamin B12, Testosterone -discussed possible etiologies. Will check labs and follow up

## 2020-07-28 LAB — CBC WITH DIFFERENTIAL/PLATELET
Basophils Absolute: 0.1 10*3/uL (ref 0.0–0.2)
Basos: 1 %
EOS (ABSOLUTE): 0.3 10*3/uL (ref 0.0–0.4)
Eos: 3 %
Hematocrit: 41.8 % (ref 37.5–51.0)
Hemoglobin: 14.8 g/dL (ref 13.0–17.7)
Immature Grans (Abs): 0 10*3/uL (ref 0.0–0.1)
Immature Granulocytes: 0 %
Lymphocytes Absolute: 2.8 10*3/uL (ref 0.7–3.1)
Lymphs: 35 %
MCH: 32.2 pg (ref 26.6–33.0)
MCHC: 35.4 g/dL (ref 31.5–35.7)
MCV: 91 fL (ref 79–97)
Monocytes Absolute: 0.4 10*3/uL (ref 0.1–0.9)
Monocytes: 5 %
Neutrophils Absolute: 4.4 10*3/uL (ref 1.4–7.0)
Neutrophils: 56 %
Platelets: 252 10*3/uL (ref 150–450)
RBC: 4.59 x10E6/uL (ref 4.14–5.80)
RDW: 12.9 % (ref 11.6–15.4)
WBC: 8 10*3/uL (ref 3.4–10.8)

## 2020-07-28 LAB — FSH/LH
FSH: 2.8 m[IU]/mL (ref 1.5–12.4)
LH: 2 m[IU]/mL (ref 1.7–8.6)

## 2020-07-28 LAB — TESTOSTERONE: Testosterone: 212 ng/dL — ABNORMAL LOW (ref 264–916)

## 2020-07-28 LAB — COMPREHENSIVE METABOLIC PANEL
ALT: 18 IU/L (ref 0–44)
AST: 16 IU/L (ref 0–40)
Albumin/Globulin Ratio: 2 (ref 1.2–2.2)
Albumin: 4.5 g/dL (ref 4.0–5.0)
Alkaline Phosphatase: 89 IU/L (ref 44–121)
BUN/Creatinine Ratio: 10 (ref 9–20)
BUN: 9 mg/dL (ref 6–24)
Bilirubin Total: 0.5 mg/dL (ref 0.0–1.2)
CO2: 25 mmol/L (ref 20–29)
Calcium: 9.2 mg/dL (ref 8.7–10.2)
Chloride: 102 mmol/L (ref 96–106)
Creatinine, Ser: 0.94 mg/dL (ref 0.76–1.27)
GFR calc Af Amer: 117 mL/min/{1.73_m2} (ref >59)
GFR calc non Af Amer: 101 mL/min/{1.73_m2} (ref >59)
Globulin, Total: 2.3 g/dL (ref 1.5–4.5)
Glucose: 86 mg/dL (ref 65–99)
Potassium: 4.7 mmol/L (ref 3.5–5.2)
Sodium: 139 mmol/L (ref 134–144)
Total Protein: 6.8 g/dL (ref 6.0–8.5)

## 2020-07-28 LAB — HEPATITIS C ANTIBODY: Hep C Virus Ab: <0.1 s/co ratio (ref 0.0–0.9)

## 2020-07-28 LAB — VITAMIN D 25 HYDROXY (VIT D DEFICIENCY, FRACTURES): Vit D, 25-Hydroxy: 24 ng/mL — ABNORMAL LOW (ref 30.0–100.0)

## 2020-07-28 LAB — T4, FREE: Free T4: 1.25 ng/dL (ref 0.82–1.77)

## 2020-07-28 LAB — PSA: Prostate Specific Ag, Serum: 0.4 ng/mL (ref 0.0–4.0)

## 2020-07-28 LAB — TSH: TSH: 3.11 u[IU]/mL (ref 0.450–4.500)

## 2020-07-28 LAB — HEPATITIS B SURFACE ANTIBODY,QUALITATIVE: Hep B Surface Ab, Qual: NONREACTIVE

## 2020-07-28 LAB — HEPATITIS B CORE ANTIBODY, IGM: Hep B C IgM: NEGATIVE

## 2020-07-28 LAB — PROLACTIN: Prolactin: 21.4 ng/mL — ABNORMAL HIGH (ref 4.0–15.2)

## 2020-07-28 LAB — GC/CHLAMYDIA PROBE AMP
Chlamydia trachomatis, NAA: NEGATIVE
Neisseria Gonorrhoeae by PCR: NEGATIVE

## 2020-07-28 LAB — HIV ANTIBODY (ROUTINE TESTING W REFLEX): HIV Screen 4th Generation wRfx: NONREACTIVE

## 2020-07-28 LAB — VITAMIN B12: Vitamin B-12: 497 pg/mL (ref 232–1245)

## 2020-07-28 LAB — RPR: RPR Ser Ql: NONREACTIVE

## 2020-07-28 LAB — HEPATITIS B SURFACE ANTIGEN: Hepatitis B Surface Ag: NEGATIVE

## 2020-07-28 LAB — T3: T3, Total: 170 ng/dL (ref 71–180)

## 2020-07-29 ENCOUNTER — Encounter: Payer: Self-pay | Admitting: Family Medicine

## 2020-07-29 ENCOUNTER — Other Ambulatory Visit: Payer: Self-pay | Admitting: Family Medicine

## 2020-07-29 DIAGNOSIS — Z79899 Other long term (current) drug therapy: Secondary | ICD-10-CM

## 2020-07-29 DIAGNOSIS — R7989 Other specified abnormal findings of blood chemistry: Secondary | ICD-10-CM

## 2020-07-29 DIAGNOSIS — Z7251 High risk heterosexual behavior: Secondary | ICD-10-CM

## 2020-07-29 HISTORY — DX: Other specified abnormal findings of blood chemistry: R79.89

## 2020-07-29 MED ORDER — EMTRICITABINE-TENOFOVIR DF 200-300 MG PO TABS: 1.0000 | ORAL_TABLET | Freq: Every day | ORAL | 2 refills | Status: DC

## 2020-07-29 NOTE — Progress Notes (Signed)
He does not have immunity against hepatitis B and needs to be vaccinated.  We can start him on Truvada if he is ready since he is negative for HIV. Let me know and we can send this to his pharmacy. He is coming in for labs next week. Needs testosterone and prolactin.

## 2020-07-29 NOTE — Progress Notes (Signed)
I sent in Truvada prescription and sent him a Mychart message. Ok to start hep b

## 2020-08-03 ENCOUNTER — Other Ambulatory Visit: Payer: Self-pay

## 2020-08-03 ENCOUNTER — Other Ambulatory Visit (INDEPENDENT_AMBULATORY_CARE_PROVIDER_SITE_OTHER): Payer: BC Managed Care – PPO

## 2020-08-03 DIAGNOSIS — Z23 Encounter for immunization: Secondary | ICD-10-CM

## 2020-08-03 DIAGNOSIS — R7989 Other specified abnormal findings of blood chemistry: Secondary | ICD-10-CM

## 2020-08-04 ENCOUNTER — Encounter: Payer: Self-pay | Admitting: Family Medicine

## 2020-08-10 LAB — PROLACTIN: Prolactin: 12.6 ng/mL (ref 4.0–15.2)

## 2020-08-10 LAB — TESTOSTERONE, % FREE: Testosterone-% Free: 1.4 %

## 2020-08-10 LAB — TESTOSTERONE: Testosterone: 360 ng/dL (ref 264–916)

## 2020-09-03 ENCOUNTER — Other Ambulatory Visit (INDEPENDENT_AMBULATORY_CARE_PROVIDER_SITE_OTHER): Payer: BC Managed Care – PPO

## 2020-09-03 ENCOUNTER — Other Ambulatory Visit: Payer: Self-pay

## 2020-09-03 DIAGNOSIS — Z789 Other specified health status: Secondary | ICD-10-CM

## 2020-10-11 ENCOUNTER — Other Ambulatory Visit: Payer: Self-pay | Admitting: Family Medicine

## 2020-10-11 DIAGNOSIS — Z7251 High risk heterosexual behavior: Secondary | ICD-10-CM

## 2020-12-31 ENCOUNTER — Encounter (INDEPENDENT_AMBULATORY_CARE_PROVIDER_SITE_OTHER): Payer: Self-pay

## 2020-12-31 ENCOUNTER — Encounter: Payer: Self-pay | Admitting: Family Medicine

## 2020-12-31 ENCOUNTER — Other Ambulatory Visit: Payer: Self-pay

## 2020-12-31 DIAGNOSIS — Z7251 High risk heterosexual behavior: Secondary | ICD-10-CM

## 2020-12-31 MED ORDER — EMTRICITABINE-TENOFOVIR DF 200-300 MG PO TABS: 1.0000 | ORAL_TABLET | Freq: Every day | ORAL | 0 refills | Status: DC

## 2021-01-13 ENCOUNTER — Other Ambulatory Visit: Payer: Self-pay | Admitting: Family Medicine

## 2021-01-13 DIAGNOSIS — Z7251 High risk heterosexual behavior: Secondary | ICD-10-CM

## 2021-02-12 ENCOUNTER — Ambulatory Visit: Payer: BC Managed Care – PPO | Admitting: Family Medicine

## 2021-02-12 ENCOUNTER — Other Ambulatory Visit: Payer: Self-pay

## 2021-02-12 ENCOUNTER — Encounter: Payer: Self-pay | Admitting: Family Medicine

## 2021-02-12 VITALS — BP 120/80 | HR 78

## 2021-02-12 DIAGNOSIS — Z7251 High risk heterosexual behavior: Secondary | ICD-10-CM | POA: Diagnosis not present

## 2021-02-12 DIAGNOSIS — Z79899 Other long term (current) drug therapy: Secondary | ICD-10-CM | POA: Diagnosis not present

## 2021-02-12 NOTE — Progress Notes (Signed)
   Subjective:    Patient ID: Burney Gauze, male    DOB: [birth year], 40 y.o.   MRN: [id number]  HPI Chief Complaint  Patient presents with  . 3 month follow-up on medicine    3 month follow-up on medicine.    He is here to follow-up on Truvada for preexposure prophylaxis for HIV.  He has a male sexual partner and denies any concerns for STD screening today other than routine HIV testing. Denies any symptoms.  Recently completed hep B vaccinations since he was not immune.  No new concerns today.   Review of Systems Pertinent positives and negatives in the history of present illness.     Objective:   Physical Exam BP 120/80   Pulse 78   Alert and oriented in no acute distress.  Respirations unlabored.  Normal speech, mood and memory.      Assessment & Plan:  On pre-exposure prophylaxis for HIV - Plan: HIV Antibody (routine testing w rflx), CBC with Differential/Platelet, Comprehensive metabolic panel  High risk sexual behavior, unspecified type - Plan: HIV Antibody (routine testing w rflx)  Medication management - Plan: CBC with Differential/Platelet, Comprehensive metabolic panel  Reports doing well on Truvada and would like to continue it.  We will check routine HIV test and refill medication for 3 months as long as the test is negative. Reviewed recent labs including normal testosterone and prolactin levels after abnormal results were found.  He reports feeling at baseline and no further testing needed.

## 2021-02-13 LAB — CBC WITH DIFFERENTIAL/PLATELET
Basophils Absolute: 0.1 10*3/uL (ref 0.0–0.2)
Basos: 1 %
EOS (ABSOLUTE): 0.3 10*3/uL (ref 0.0–0.4)
Eos: 3 %
Hematocrit: 40.6 % (ref 37.5–51.0)
Hemoglobin: 14.4 g/dL (ref 13.0–17.7)
Immature Grans (Abs): 0 10*3/uL (ref 0.0–0.1)
Immature Granulocytes: 0 %
Lymphocytes Absolute: 2.4 10*3/uL (ref 0.7–3.1)
Lymphs: 27 %
MCH: 32.2 pg (ref 26.6–33.0)
MCHC: 35.5 g/dL (ref 31.5–35.7)
MCV: 91 fL (ref 79–97)
Monocytes Absolute: 0.5 10*3/uL (ref 0.1–0.9)
Monocytes: 5 %
Neutrophils Absolute: 5.9 10*3/uL (ref 1.4–7.0)
Neutrophils: 64 %
Platelets: 259 10*3/uL (ref 150–450)
RBC: 4.47 x10E6/uL (ref 4.14–5.80)
RDW: 12.6 % (ref 11.6–15.4)
WBC: 9.1 10*3/uL (ref 3.4–10.8)

## 2021-02-13 LAB — COMPREHENSIVE METABOLIC PANEL
ALT: 69 IU/L — ABNORMAL HIGH (ref 0–44)
AST: 37 IU/L (ref 0–40)
Albumin/Globulin Ratio: 2.1 (ref 1.2–2.2)
Albumin: 4.5 g/dL (ref 4.0–5.0)
Alkaline Phosphatase: 125 IU/L — ABNORMAL HIGH (ref 44–121)
BUN/Creatinine Ratio: 12 (ref 9–20)
BUN: 10 mg/dL (ref 6–24)
Bilirubin Total: 0.6 mg/dL (ref 0.0–1.2)
CO2: 23 mmol/L (ref 20–29)
Calcium: 9.4 mg/dL (ref 8.7–10.2)
Chloride: 100 mmol/L (ref 96–106)
Creatinine, Ser: 0.84 mg/dL (ref 0.76–1.27)
Globulin, Total: 2.1 g/dL (ref 1.5–4.5)
Glucose: 83 mg/dL (ref 65–99)
Potassium: 4.6 mmol/L (ref 3.5–5.2)
Sodium: 140 mmol/L (ref 134–144)
Total Protein: 6.6 g/dL (ref 6.0–8.5)
eGFR: 112 mL/min/{1.73_m2} (ref >59)

## 2021-02-13 LAB — HIV ANTIBODY (ROUTINE TESTING W REFLEX): HIV Screen 4th Generation wRfx: NONREACTIVE

## 2021-02-14 NOTE — Progress Notes (Signed)
Negative HIV. Ok to give 90 days of Truvada and then he will need labs again. Also, his liver enzyme (ALT) is elevated again. We may want to check a RUQ [place] to look at his liver if he is ok with this.

## 2021-02-15 ENCOUNTER — Other Ambulatory Visit: Payer: Self-pay | Admitting: Internal Medicine

## 2021-02-15 DIAGNOSIS — Z7251 High risk heterosexual behavior: Secondary | ICD-10-CM

## 2021-02-15 DIAGNOSIS — R7401 Elevation of levels of liver transaminase levels: Secondary | ICD-10-CM

## 2021-02-15 MED ORDER — EMTRICITABINE-TENOFOVIR DF 200-300 MG PO TABS: 1.0000 | ORAL_TABLET | Freq: Every day | ORAL | 0 refills | Status: DC

## 2021-02-16 ENCOUNTER — Encounter: Payer: Self-pay | Admitting: Internal Medicine

## 2021-03-03 ENCOUNTER — Ambulatory Visit
Admission: RE | Admit: 2021-03-03 | Discharge: 2021-03-03 | Disposition: A | Discharge status: Home / Self Care | Payer: Self-pay | Source: Ambulatory Visit | Attending: Family Medicine | Admitting: Family Medicine

## 2021-03-03 DIAGNOSIS — R7401 Elevation of levels of liver transaminase levels: Secondary | ICD-10-CM

## 2021-03-04 ENCOUNTER — Other Ambulatory Visit: Payer: Self-pay | Admitting: Internal Medicine

## 2021-03-04 DIAGNOSIS — K838 Other specified diseases of biliary tract: Secondary | ICD-10-CM

## 2021-03-04 NOTE — Progress Notes (Signed)
He needs to see GI in the next couple of weeks due to dilated common bile duct and fatty liver disease.

## 2021-03-12 ENCOUNTER — Ambulatory Visit (INDEPENDENT_AMBULATORY_CARE_PROVIDER_SITE_OTHER): Payer: BC Managed Care – PPO | Admitting: Gastroenterology

## 2021-03-12 ENCOUNTER — Other Ambulatory Visit: Payer: Self-pay

## 2021-03-12 ENCOUNTER — Encounter: Payer: Self-pay | Admitting: Gastroenterology

## 2021-03-12 ENCOUNTER — Other Ambulatory Visit (INDEPENDENT_AMBULATORY_CARE_PROVIDER_SITE_OTHER): Payer: BC Managed Care – PPO

## 2021-03-12 VITALS — BP 112/84 | HR 74 | Ht 70.0 in | Wt 233.8 lb

## 2021-03-12 DIAGNOSIS — R7989 Other specified abnormal findings of blood chemistry: Secondary | ICD-10-CM | POA: Diagnosis not present

## 2021-03-12 LAB — HEPATIC FUNCTION PANEL
ALT: 8 U/L (ref 0–53)
AST: 10 U/L (ref 0–37)
Albumin: 4.2 g/dL (ref 3.5–5.2)
Alkaline Phosphatase: 60 U/L (ref 39–117)
Bilirubin, Direct: 0.1 mg/dL (ref 0.0–0.3)
Total Bilirubin: 0.5 mg/dL (ref 0.2–1.2)
Total Protein: 6.5 g/dL (ref 6.0–8.3)

## 2021-03-12 NOTE — Progress Notes (Signed)
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    He had his gallbladder removed for 5 years ago after multiple biliary colic episodes culminating in significant acute cholecystitis.  It sounds like it was a rather uncomplicated laparoscopic cholecystectomy.  He did not even need to spend 1 night in the hospital.  Since then he has never been bothered by those biliary symptoms again.  He has no significant abdominal pains.  He had basic blood testing as part of routine physical and 2 of his liver tests were slightly elevated.  He does not drink much alcohol  He has never been told he had elevated liver tests prior to this.  His weight is overall stable.  Liver disease does not run in his family  He has never had hepatitis or jaundice  He does admit he had some URI symptoms a couple weeks before his blood draw in early April.  He did take Motrin alternating with Tylenol around that time   Old Data Reviewed: He had a laparoscopic cholecystectomy November 2018 through the [facility] for acute calculus cholecystitis.  He did not undergo an Intra-Op cholangiogram at the time of his surgery.  The op note shows that there were adhesions intra-abdominal.   Ultrasound May 2022 indication "status postcholecystectomy.  Now with elevated liver enzymes."  Findings "status postcholecystectomy with increased caliber of the common bile duct measuring 12.5 mm.  Increased parenchymal echogenicity of the liver suggesting hepatic steatosis"   Blood work April 2022 alk phos was 125, AST was normal, ALT was 69, total bilirubin was normal.  HIV was negative, CBC was completely normal.    Review of systems: Pertinent positive and negative review of systems were noted in the above HPI section. All other review negative.   Past Medical History:  Diagnosis Date  .  Elevated prolactin level 07/29/2020  . History of prostatitis   . Low testosterone 07/29/2020  . Renal disorder     Past Surgical History:  Procedure Laterality Date  . CHOLECYSTECTOMY    . HAND SURGERY Right   . HERNIA REPAIR      Current Outpatient Medications  Medication Sig Dispense Refill  . emtricitabine-tenofovir (TRUVADA) 200-300 MG tablet Take 1 tablet by mouth daily. 90 tablet 0   No current facility-administered medications for this visit.    Allergies as of 03/12/2021 - Review Complete 03/12/2021  Allergen Reaction Noted  . Amoxicillin Diarrhea and Nausea And Vomiting 12/25/2016    Family History  Problem Relation Age of Onset  . Hypertension Mother   . Heart failure Father   . Hypertension Father   . Thyroid disease Sister   . Esophageal cancer Paternal Grandfather   . Colon cancer Neg Hx   . Stomach cancer Neg Hx   . Pancreatic cancer Neg Hx   . Liver disease Neg Hx     Social History   Socioeconomic History  . Marital status: Single    Spouse name: Not on file  . Number of children: Not on file  . Years of education: Not on file  . Highest education level: Not on file  Occupational History  . Not on file  Tobacco Use  . Smoking status: Current Every Day Smoker    Packs/day: 1.00    Years: 22.00    Pack years: 22.00    Types: Cigarettes  .  Smokeless tobacco: Never Used  Vaping Use  . Vaping Use: Never used  Substance and Sexual Activity  . Alcohol use: Yes    Comment: rare  . Drug use: Never  . Sexual activity: Not Currently  Other Topics Concern  . Not on file  Social History Narrative  . Not on file   Social Determinants of Health   Financial Resource Strain: Not on file  Food Insecurity: Not on file  Transportation Needs: Not on file  Physical Activity: Not on file  Stress: Not on file  Social Connections: Not on file  Intimate Partner Violence: Not on file     Physical Exam: BP 112/84   Pulse 74   Ht 5' 10"  (1.778 m)    Wt 233 lb 12.8 oz (106.1 kg)   SpO2 97%   BMI 33.55 kg/m  Constitutional: generally well-appearing Psychiatric: alert and oriented x3 Eyes: extraocular movements intact Mouth: oral pharynx moist, no lesions Neck: supple no lymphadenopathy Cardiovascular: heart regular rate and rhythm Lungs: clear to auscultation bilaterally Abdomen: soft, nontender, nondistended, no obvious ascites, no peritoneal signs, normal bowel sounds Extremities: no lower extremity edema bilaterally Skin: no lesions on visible extremities   Assessment and plan: 40 y.o. male with slightly elevated liver tests, dilated bile duct on ultrasound  We had very nice discussion today.  I recommended first that we simply repeat his hepatic function panel to see if his liver tests are still elevated as he had URI symptoms 2 weeks before his labs were drawn originally and was taking Tylenol alternating with Motrin.  He understands that if his LFTs are completely back to normal that I would probably just repeat them in 3 months.  He also understands that if they are still elevated then he will likely need a battery of further blood test to check for chronic hepatitis viruses, iron overload, etc. and also would probably need better imaging of his bile duct with an MRI, MRCP.   Please see the "Patient Instructions" section for addition details about the plan.   Owens Loffler, MD [facility] 03/12/2021, 9:57 AM  Cc: Henson, Vickie L, NP-C  Total time on date of encounter was 45  minutes (this included time spent preparing to see the patient reviewing records; obtaining and/or reviewing separately obtained history; performing a medically appropriate exam and/or evaluation; counseling and educating the patient and family if present; ordering medications, tests or procedures if applicable; and documenting clinical information in the health record).

## 2021-03-12 NOTE — Patient Instructions (Signed)
If you are age 42 or younger, your body mass index should be between 19-25. Your Body mass index is 33.55 kg/m. If this is out of the aformentioned range listed, please consider follow up with your Primary Care Provider.   Your provider has requested that you go to the basement level for lab work before leaving today. Press "B" on the elevator. The lab is located at the first door on the left as you exit the elevator.  Due to recent changes in healthcare laws, you may see the results of your imaging and laboratory studies on MyChart before your provider has had a chance to review them.  We understand that in some cases there may be results that are confusing or concerning to you. Not all laboratory results come back in the same time frame and the provider may be waiting for multiple results in order to interpret others.  Please give [place] 48 hours in order for your provider to thoroughly review all the results before contacting the office for clarification of your results.   The [facility] providers would like to encourage you to use [facility] to communicate with providers for non-urgent requests or questions.  Due to long hold times on the telephone, sending your provider a message by [facility] may be a faster and more efficient way to get a response.  Please allow 48 business hours for a response.  Please remember that this is for non-urgent requests.   Thank you for entrusting me with your care and choosing [facility].  Dr Christella Hartigan

## 2021-03-29 IMAGING — US US SCROTUM W/ DOPPLER COMPLETE
1 series · 13 of 25 positions shown · non-contrast
Comparison: None.

CLINICAL DATA: Left testicular pain.

EXAM:
SCROTAL ULTRASOUND
DOPPLER ULTRASOUND OF THE TESTICLES
TECHNIQUE: Complete ultrasound examination of the testicles, epididymis, and
other scrotal structures was performed. Color and spectral Doppler
ultrasound were also utilized to evaluate blood flow to the
testicles.

[Series 1: us scrotum w/ doppler complete · 80 acquisitions, 13 frames shown]
[im 1/80]
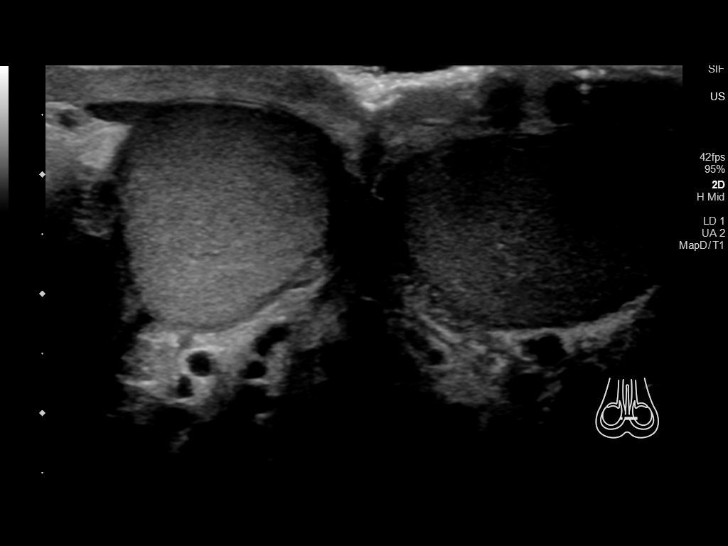
[im 7/80]
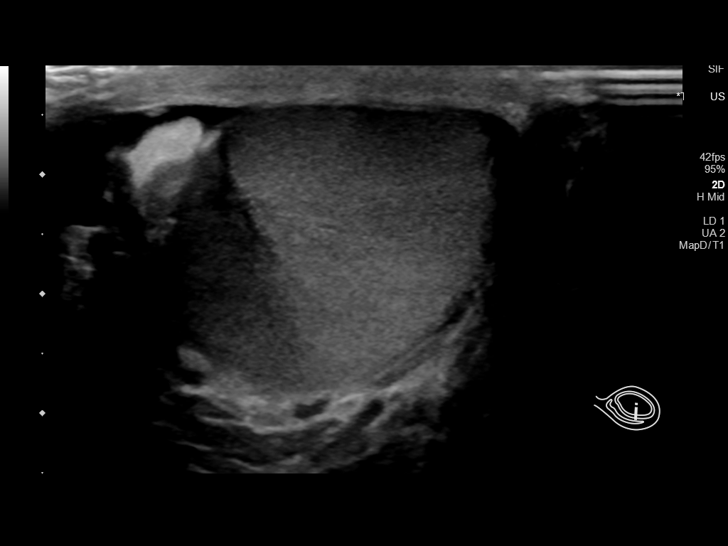
[im 14/80]
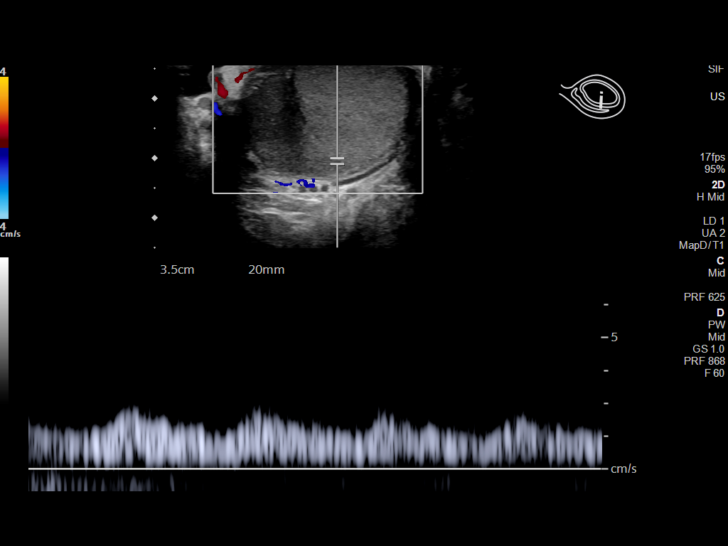
[im 20/80]
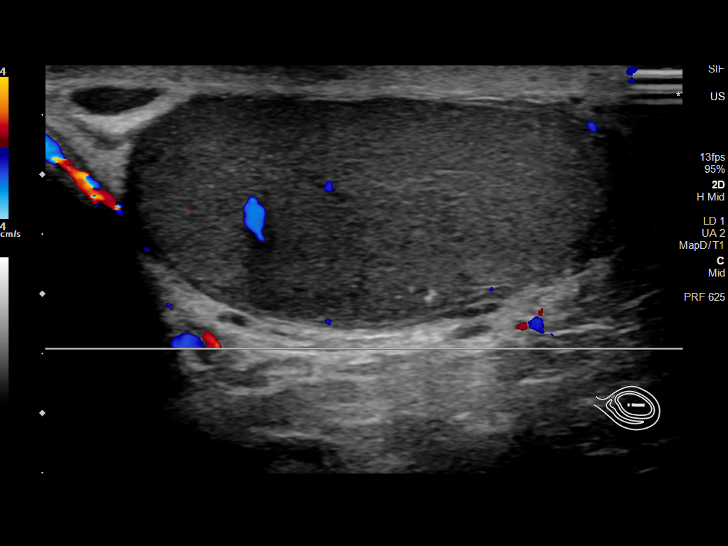
[im 27/80]
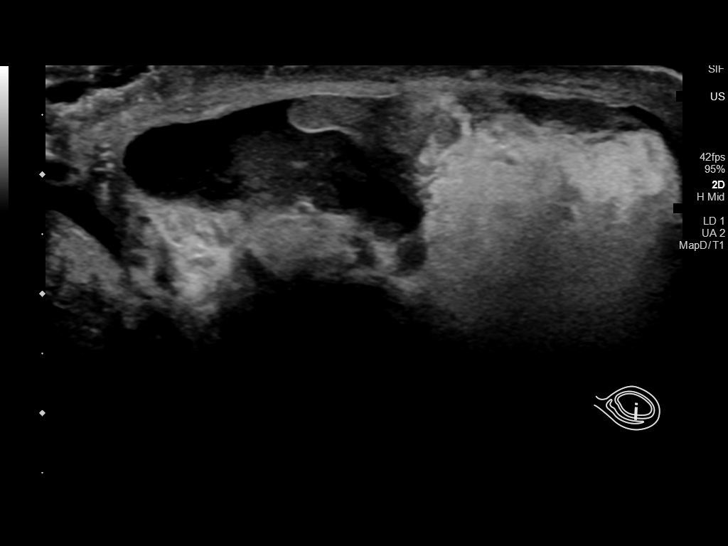
[im 33/80]
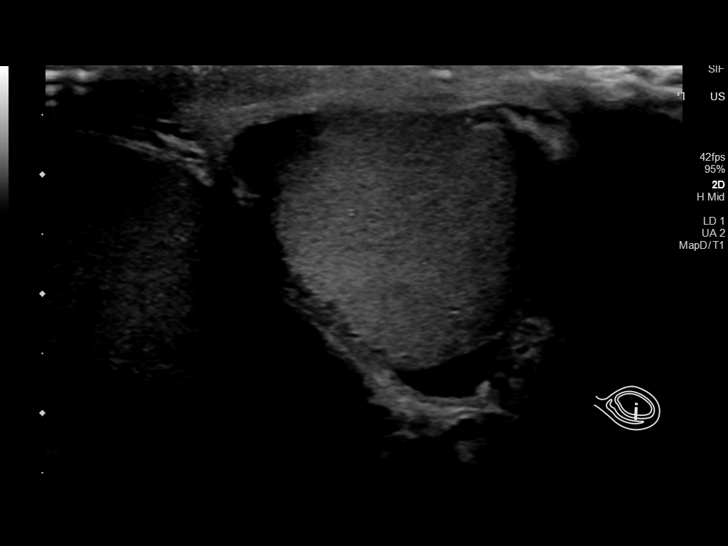
[im 40/80]
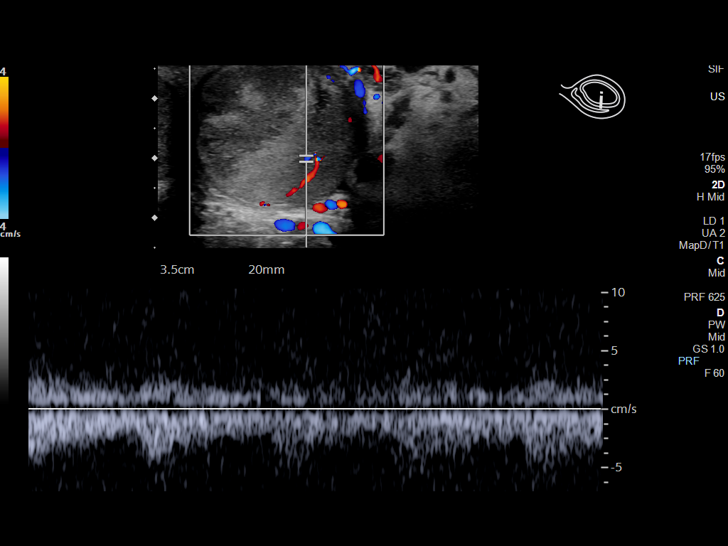
[im 47/80]
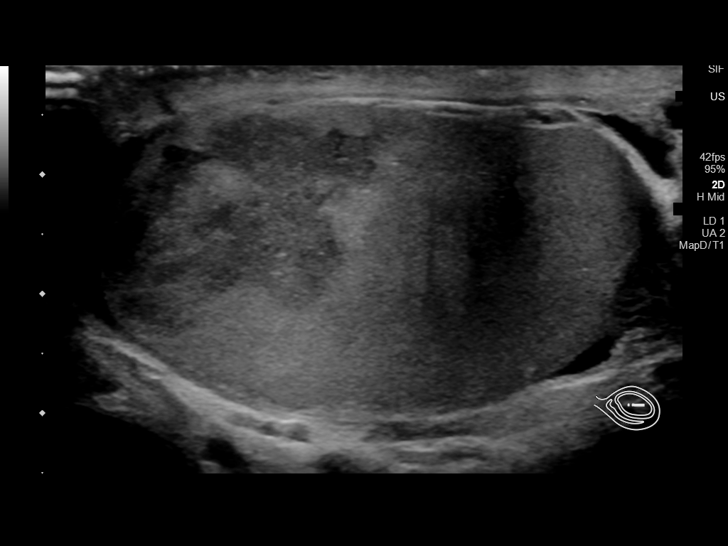
[im 53/80]
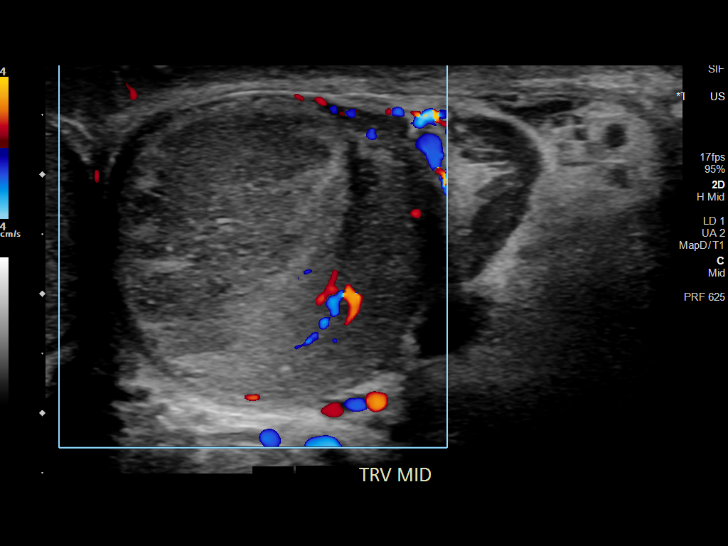
[im 60/80]
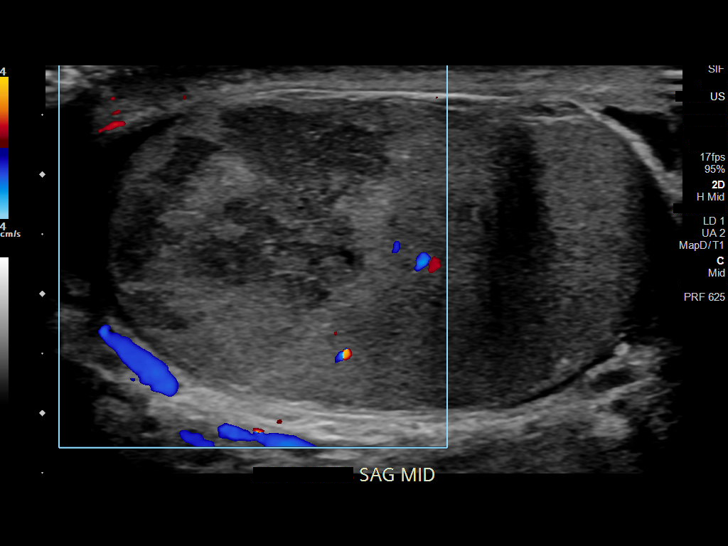
[im 66/80]
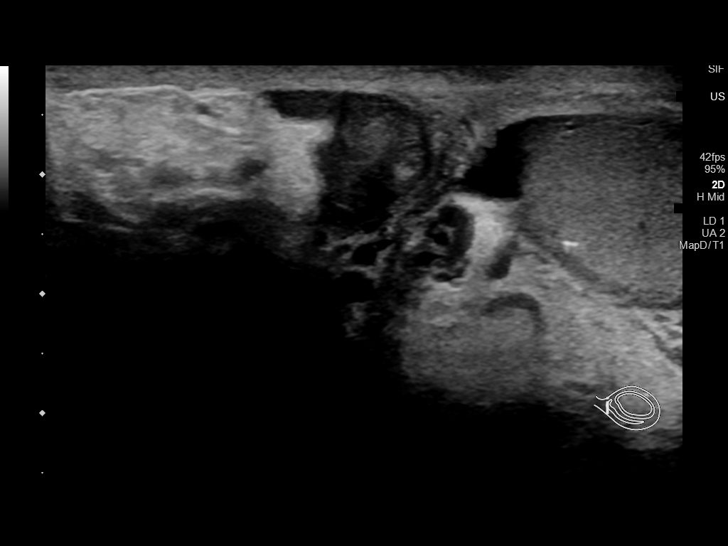
[im 73/80]
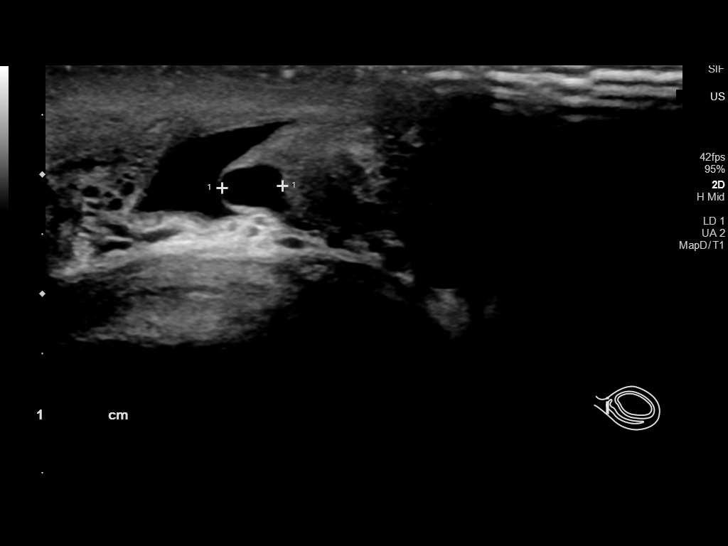
[im 80/80]
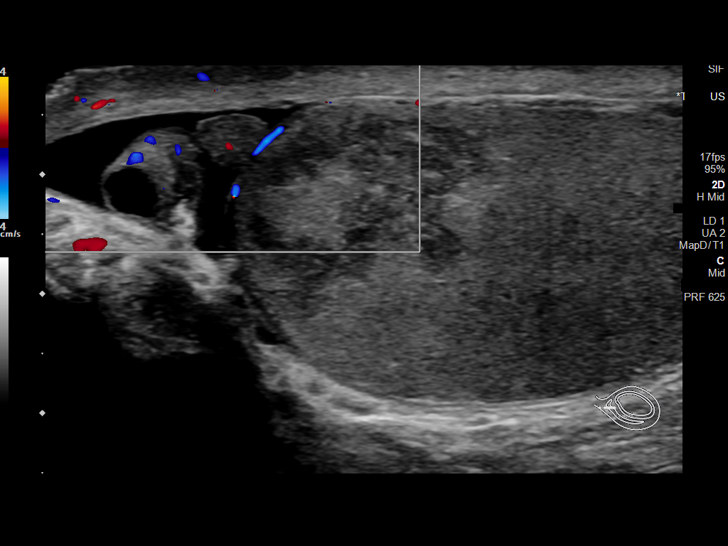

[13 of 25 positions shown; findings below may reference images not displayed]

FINDINGS: Right testicle

Measurements: 4.1 x 3.0 x 1.9 cm. No mass or microlithiasis
visualized.

Left testicle

Measurements: 4.5 x 2.9 x 2.7 cm. 2.5 x 2.2 x 2.0 cm heterogeneous
abnormality is noted in the left testicle concerning for neoplasm or
malignancy.

Right epididymis:  Normal in size and appearance.

Left epididymis: Left epididymis is mildly enlarged with probable 5
mm cyst.

Hydrocele:  Small left hydrocele is noted.

Varicocele:  None visualized.

Pulsed Doppler interrogation of both testes demonstrates normal low
resistance arterial and venous waveforms bilaterally.
IMPRESSION: 2.5 cm heterogeneous abnormality is noted in the left testicle
concerning for possible neoplasm or malignancy. Consultation with
urology is recommended.

Mild left epididymal enlargement is noted of uncertain etiology, 5
mm left epididymal cyst is noted. Small left hydrocele is noted.

There is no Doppler evidence of testicular torsion.

## 2021-03-29 IMAGING — CT CT RENAL STONE PROTOCOL
2 of 4 series · 16 of 46 positions shown, 18 images · non-contrast
Comparison: None.

CLINICAL DATA: Flank pain.  Evaluate for kidney stones.

EXAM:
CT ABDOMEN AND PELVIS WITHOUT CONTRAST
TECHNIQUE: Multidetector CT imaging of the abdomen and pelvis was performed
following the standard protocol without IV contrast.

[Series 2: axial st · axial · 0.73mm/px · z∈[-576,-166]mm · 13 of 94 slices shown, 15 images]
[im 6/94  soft-tissue]
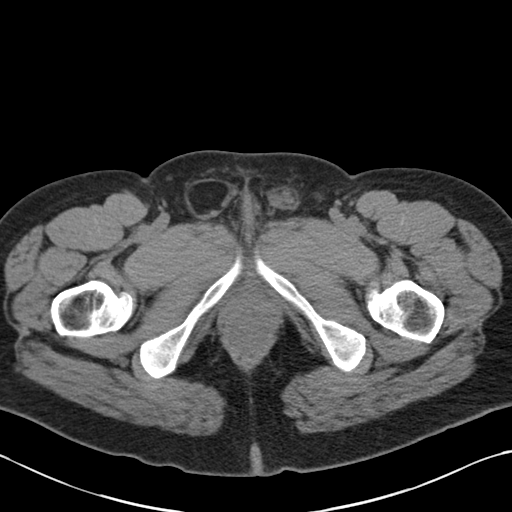
[im 6/94  bone]
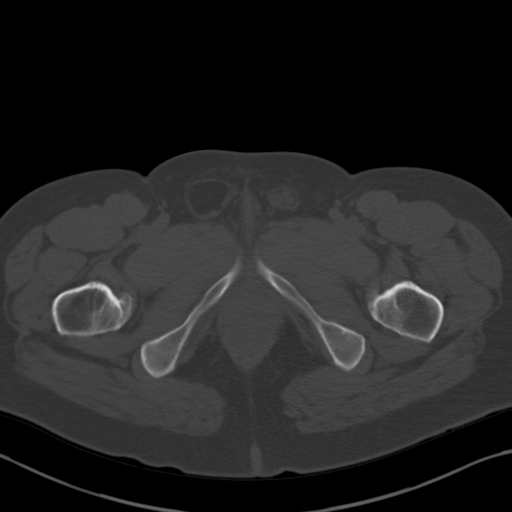
[im 11/94  soft-tissue]
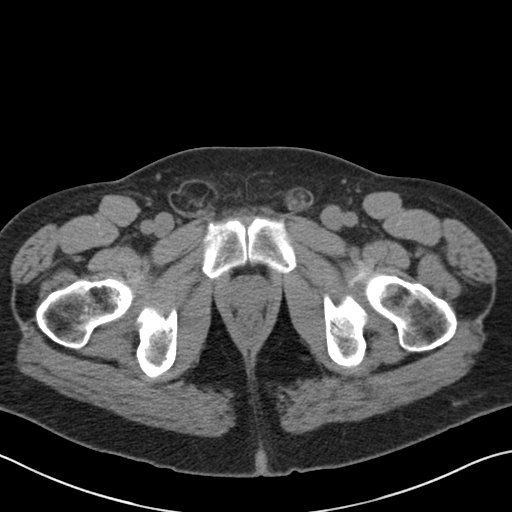
[im 21/94  soft-tissue]
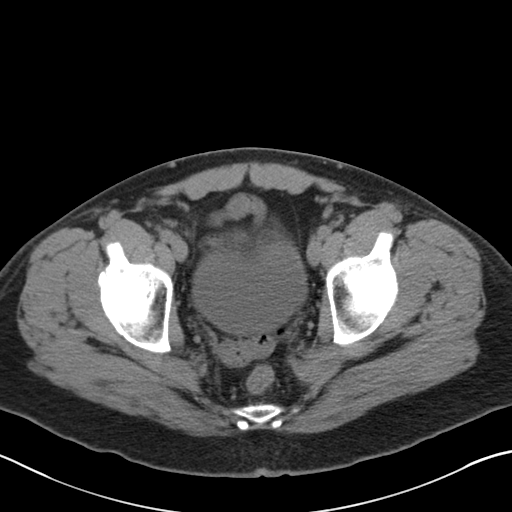
[im 26/94  soft-tissue]
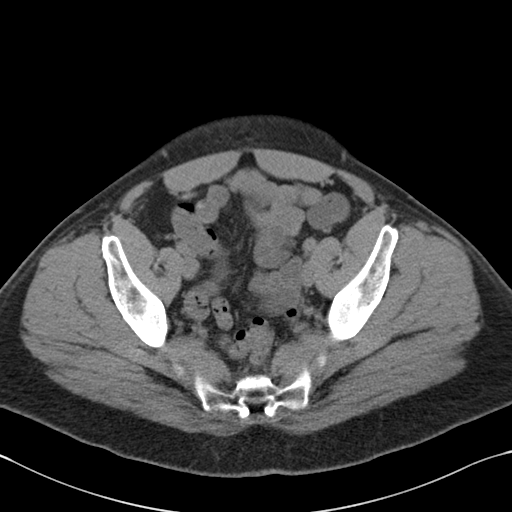
[im 32/94  soft-tissue]
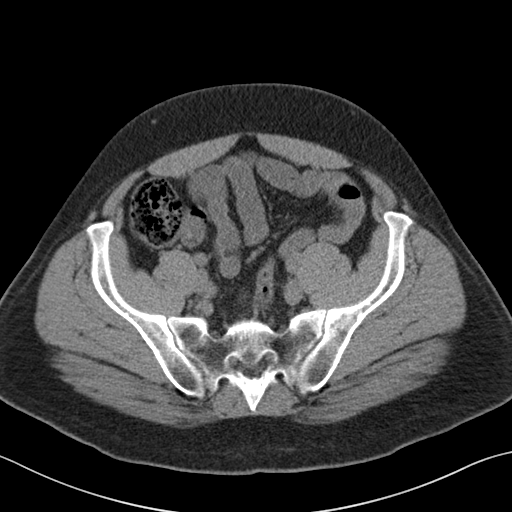
[im 42/94  soft-tissue]
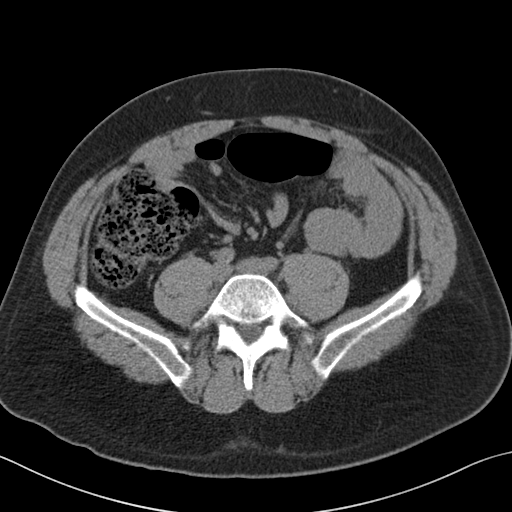
[im 47/94  soft-tissue]
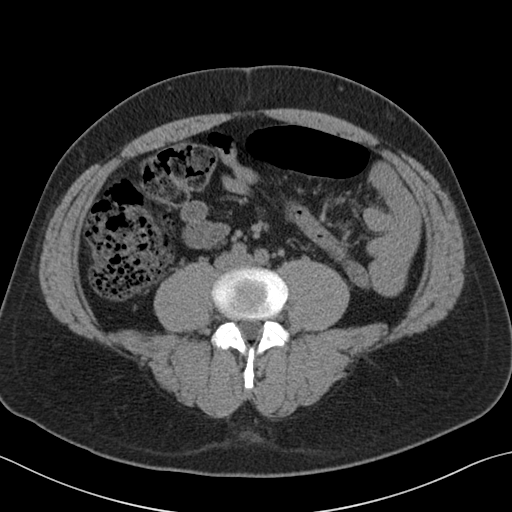
[im 52/94  soft-tissue]
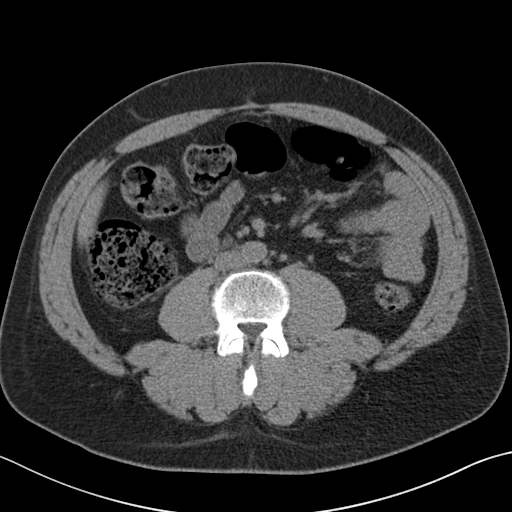
[im 63/94  soft-tissue]
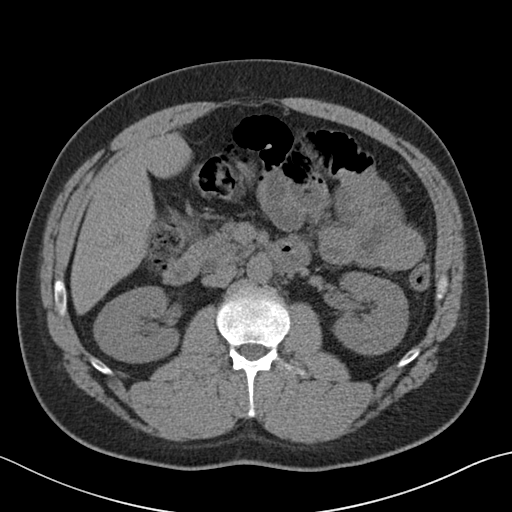
[im 63/94  bone]
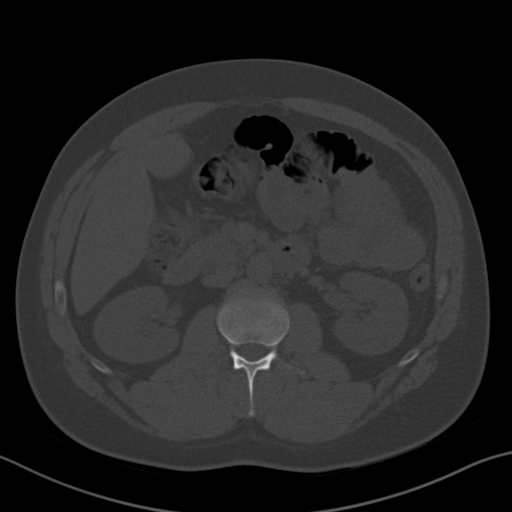
[im 68/94  soft-tissue]
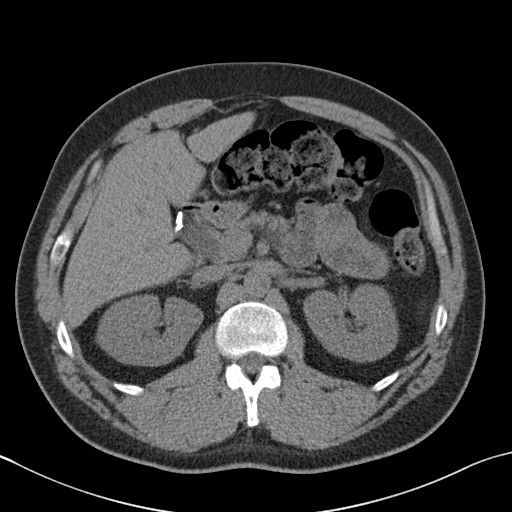
[im 73/94  soft-tissue]
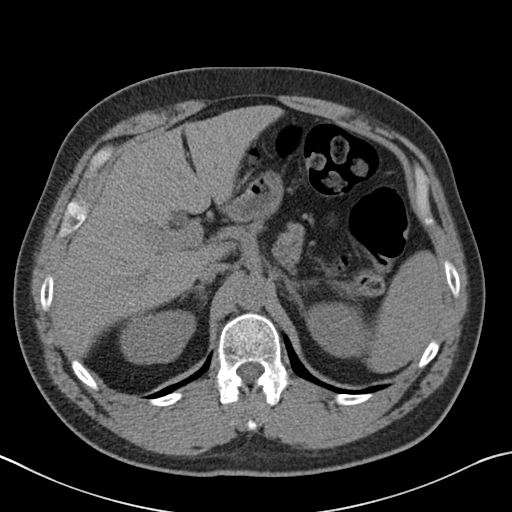
[im 83/94  soft-tissue]
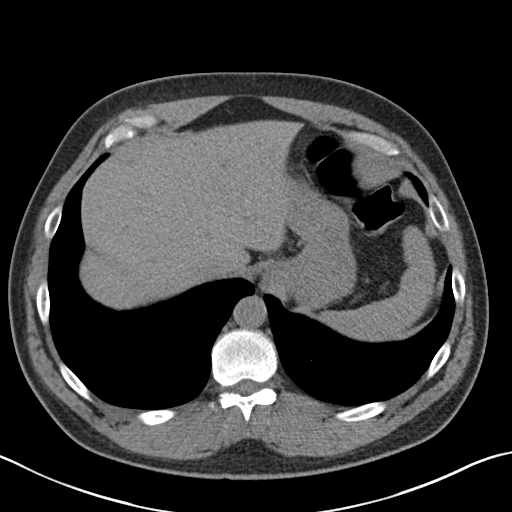
[im 88/94  soft-tissue]
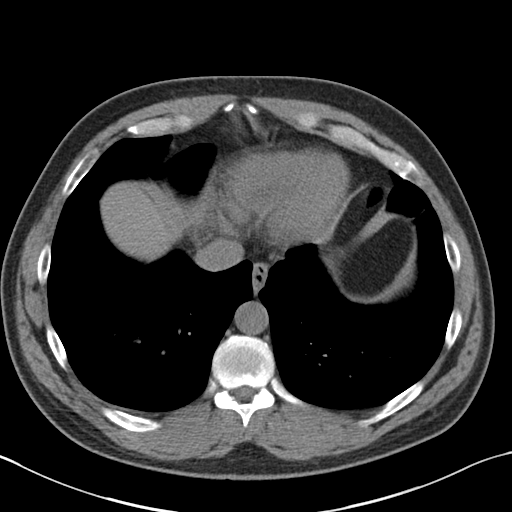

[Series 4: coronal · coronal · 0.75mm/px · 3 of 157 slices shown]
[im 53/157  soft-tissue]
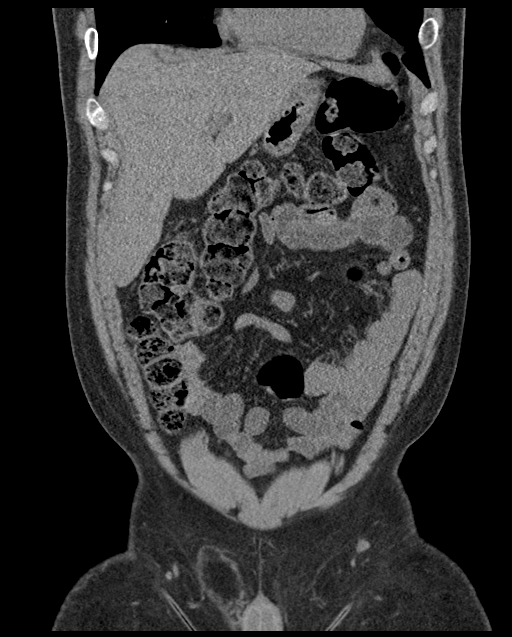
[im 70/157  soft-tissue]
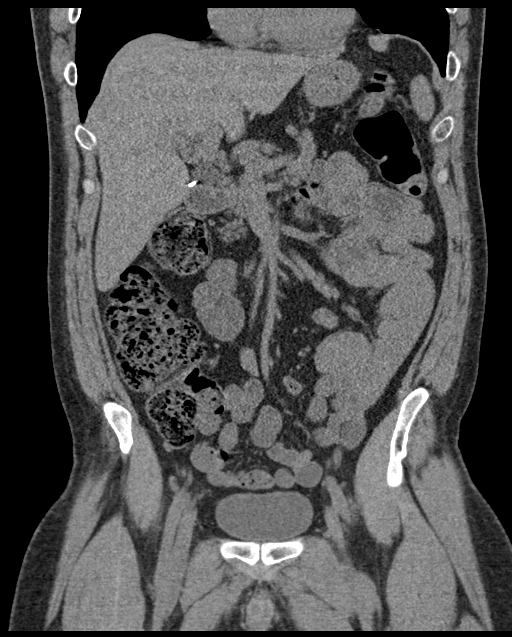
[im 87/157  soft-tissue]
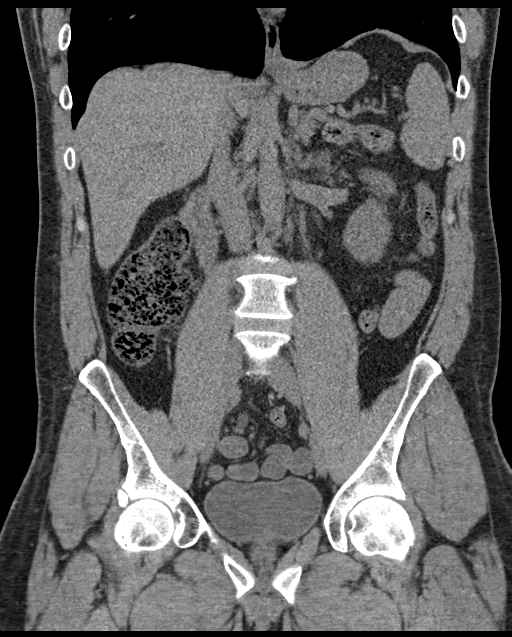

[16 of 46 positions shown; findings below may reference images not displayed]

FINDINGS: Lower chest: No acute abnormality.

Hepatobiliary: No focal liver abnormality identified. Previous
cholecystectomy. Increase caliber of the common bile duct is noted
measuring up to 1.2 cm.

Pancreas: Unremarkable. No pancreatic ductal dilatation or
surrounding inflammatory changes.

Spleen: Normal in size without focal abnormality.

Adrenals/Urinary Tract: Normal adrenal glands. No kidney stones
identified bilaterally. No hydronephrosis or hydroureter. No
ureteral calculi. Urinary bladder appears normal.

Stomach/Bowel: Stomach appears normal. The small bowel loops have a
normal course and caliber. The appendix is visualized and appears
normal. A moderate stool burden is noted throughout the colon. No
abnormal bowel wall thickening, inflammation or distension.

Vascular/Lymphatic: Aortic atherosclerosis. No aneurysm. No
abdominopelvic adenopathy identified.

Reproductive: Prostate is unremarkable.

Other: Bilateral inguinal hernias containing fat only are
identified, right greater than left. There is a small umbilical
hernia which contains fat only, image 50/2. Previous herniorrhaphy
within the supraumbilical, midline ventral abdominal wall noted.
Laxity of the hernia mesh is identified along the undersurface of
the ventral abdominal wall.

Musculoskeletal: No acute or significant osseous findings.
IMPRESSION: 1. No acute findings. No evidence for nephrolithiasis or
hydronephrosis.
2. Moderate stool burden identified within the colon
3. Bilateral fat contained inguinal hernias and fat containing
umbilical hernia.

## 2021-04-27 ENCOUNTER — Other Ambulatory Visit: Payer: Self-pay | Admitting: Family Medicine

## 2021-04-27 DIAGNOSIS — Z7251 High risk heterosexual behavior: Secondary | ICD-10-CM

## 2021-07-26 ENCOUNTER — Encounter: Payer: Self-pay | Admitting: Family Medicine

## 2021-07-28 IMAGING — US US SCROTUM W/ DOPPLER COMPLETE
1 series · 13 of 25 positions shown · non-contrast
Comparison: Ultrasound dated 07/30/2019 and CT dated 07/30/2019

CLINICAL DATA: 38-year-old male with left testicular abnormality
seen on the prior ultrasound of 07/30/2019. Patient presenting for
follow-up exam.

EXAM:
SCROTAL ULTRASOUND
DOPPLER ULTRASOUND OF THE TESTICLES
TECHNIQUE: Complete ultrasound examination of the testicles, epididymis, and
other scrotal structures was performed. Color and spectral Doppler
ultrasound were also utilized to evaluate blood flow to the
testicles.

[Series 1: us scrotum w/ doppler complete · 0.06mm/px · 13 of 55 slices shown]
[im 1/55]
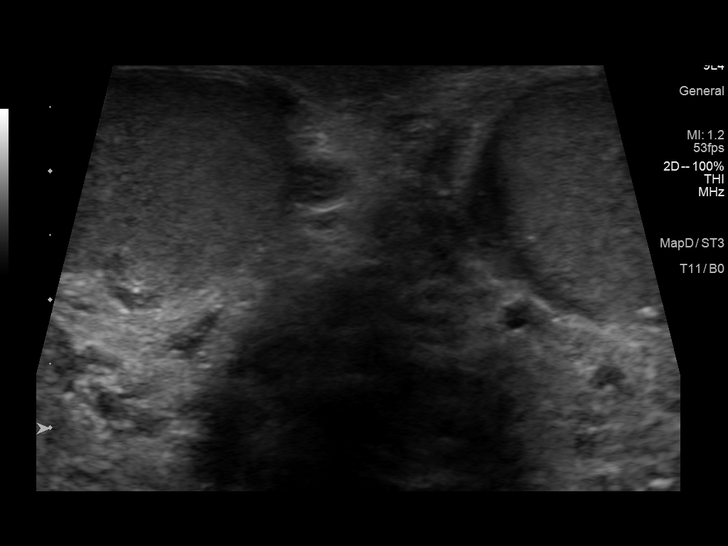
[im 5/55]
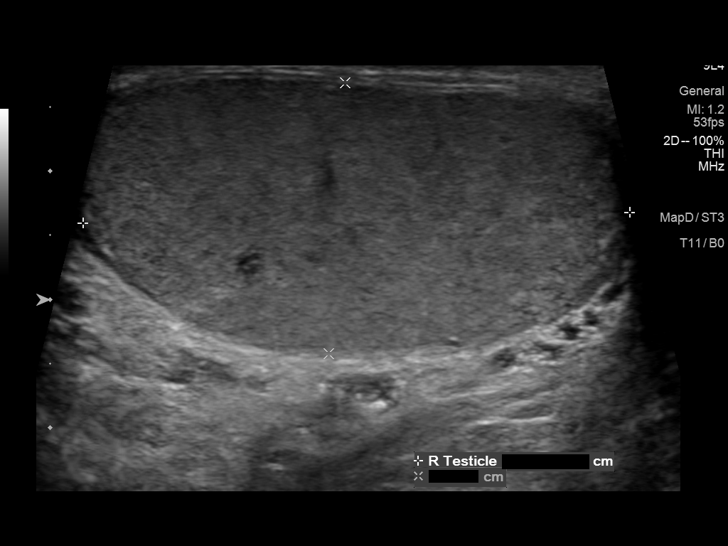
[im 10/55]
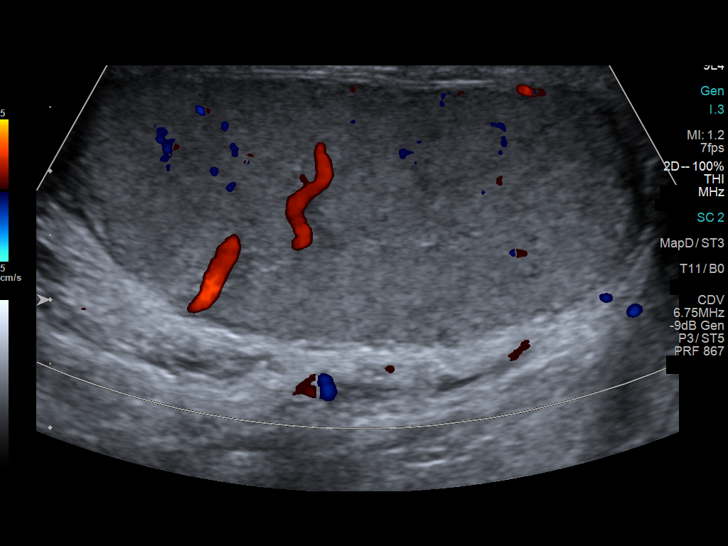
[im 14/55]
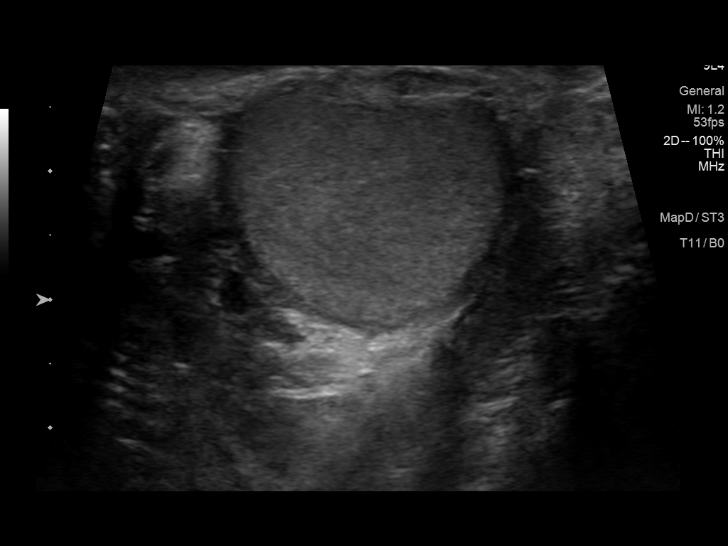
[im 19/55]
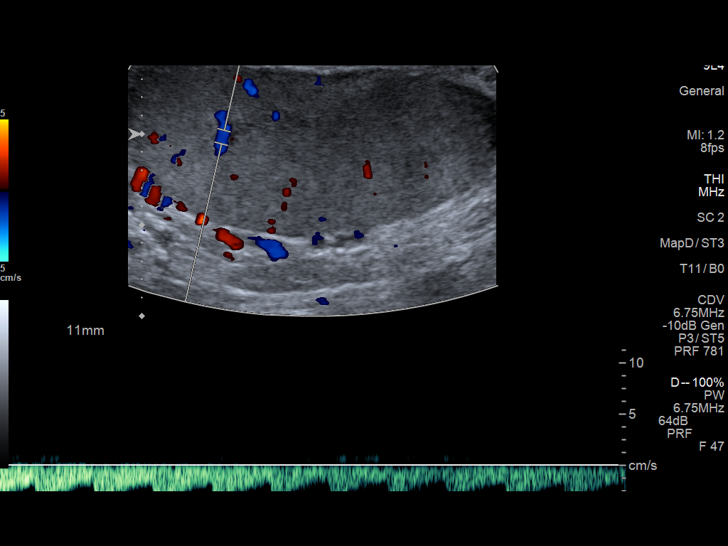
[im 23/55]
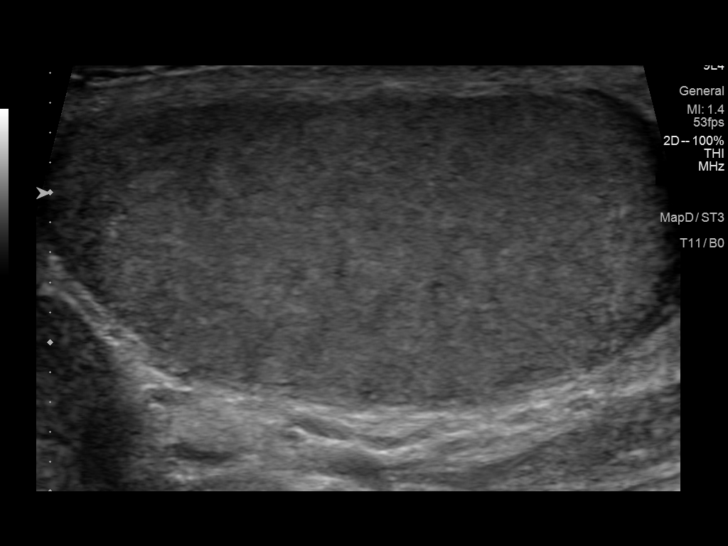
[im 28/55]
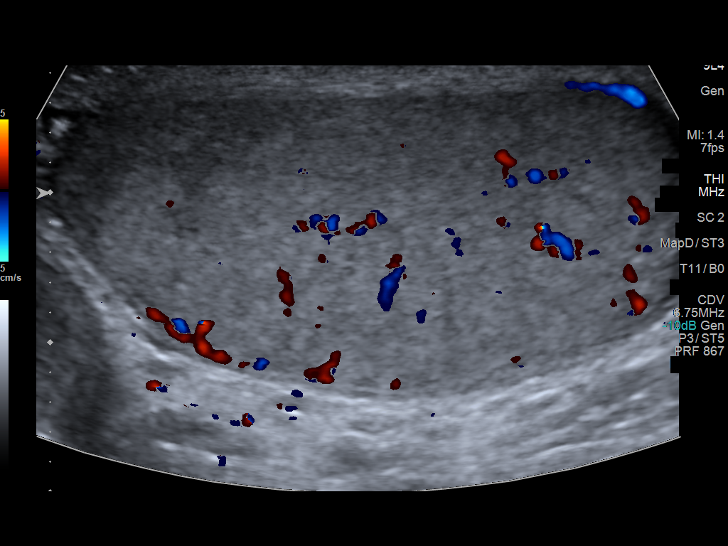
[im 32/55]
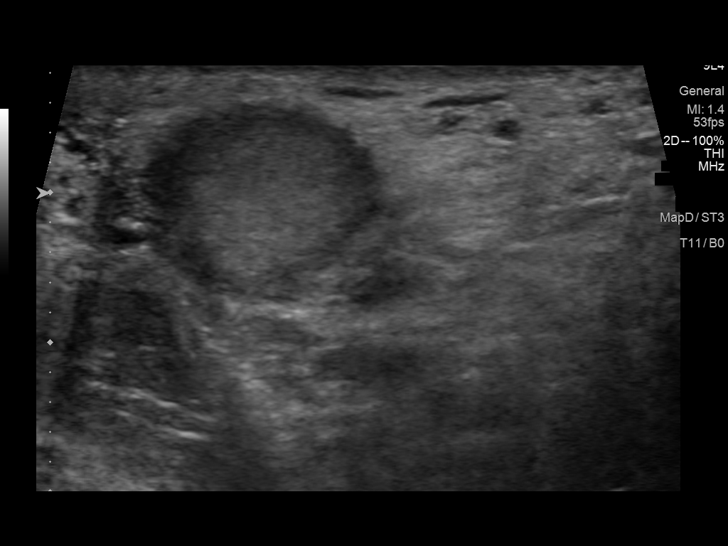
[im 37/55]
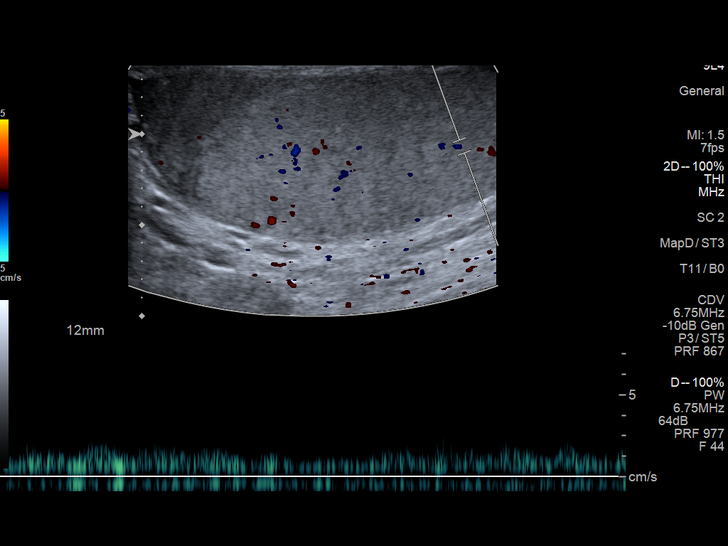
[im 41/55]
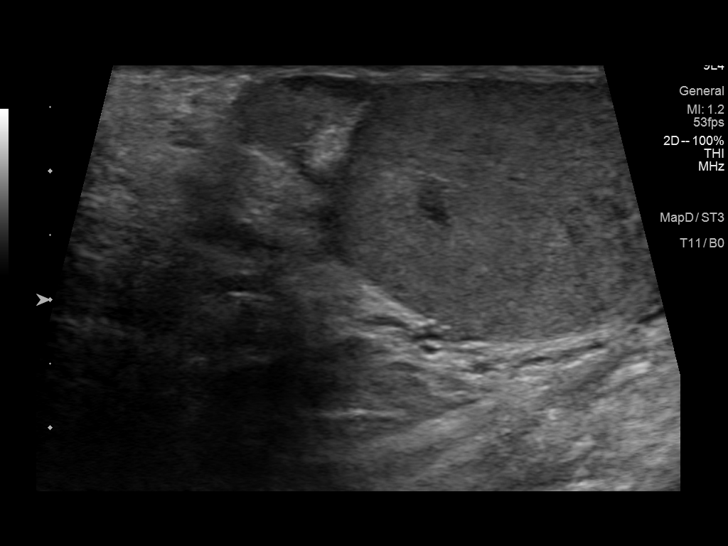
[im 46/55]
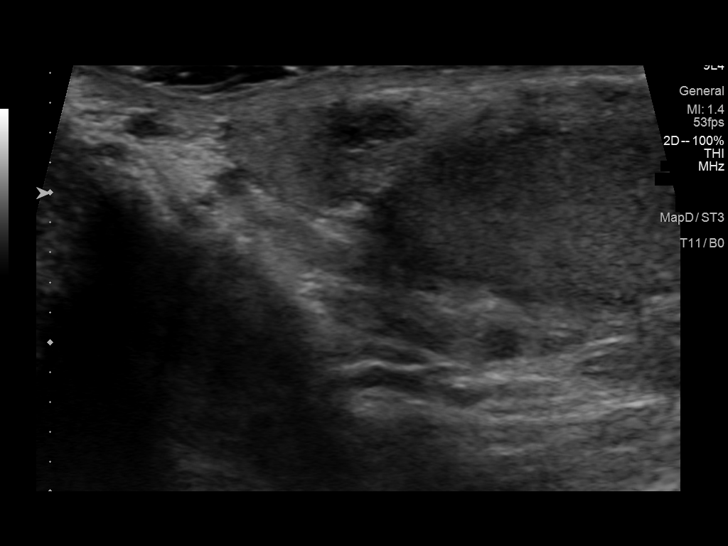
[im 50/55]
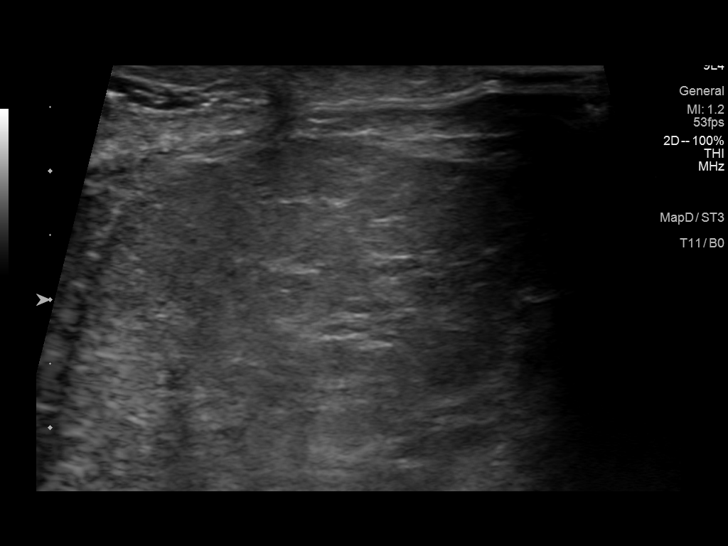
[im 55/55]
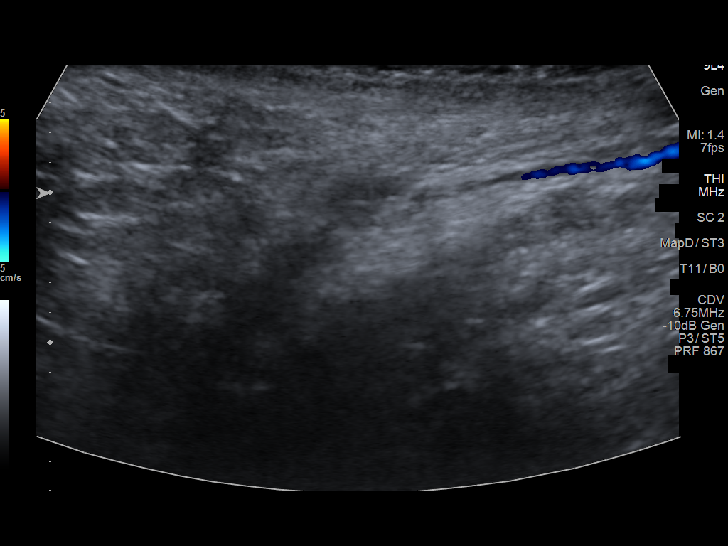

[13 of 25 positions shown; findings below may reference images not displayed]

FINDINGS: Right testicle

Measurements: 4.3 x 2.1 x 2.8 cm. No mass or microlithiasis
visualized.

Left testicle

Measurements: 4.1 x 2.0 x 2.6 cm. Slightly heterogeneous. No
discrete mass. The previously seen area of heterogeneity is not
visualized on today's exam and has resolved. It likely represented
an area of infarct or necrotic collection.

Right epididymis:  Normal in size and appearance.

Left epididymis:  Normal in size and appearance.

Hydrocele:  None visualized.

Varicocele:  None visualized.

Pulsed Doppler interrogation of both testes demonstrates normal low
resistance arterial and venous waveforms bilaterally.
IMPRESSION: 1. No acute findings.
2. Previously seen heterogeneous area within the left testicle has
resolved.

## 2021-08-03 ENCOUNTER — Ambulatory Visit (INDEPENDENT_AMBULATORY_CARE_PROVIDER_SITE_OTHER): Payer: BC Managed Care – PPO | Admitting: Family Medicine

## 2021-08-03 ENCOUNTER — Encounter: Payer: Self-pay | Admitting: Family Medicine

## 2021-08-03 ENCOUNTER — Other Ambulatory Visit: Payer: Self-pay

## 2021-08-03 VITALS — BP 122/88 | HR 100 | Ht 70.0 in | Wt 230.8 lb

## 2021-08-03 DIAGNOSIS — Z7251 High risk heterosexual behavior: Secondary | ICD-10-CM | POA: Diagnosis not present

## 2021-08-03 DIAGNOSIS — R7401 Elevation of levels of liver transaminase levels: Secondary | ICD-10-CM | POA: Diagnosis not present

## 2021-08-03 DIAGNOSIS — Z23 Encounter for immunization: Secondary | ICD-10-CM | POA: Diagnosis not present

## 2021-08-03 DIAGNOSIS — Z79899 Other long term (current) drug therapy: Secondary | ICD-10-CM | POA: Diagnosis not present

## 2021-08-03 NOTE — Progress Notes (Signed)
   Subjective:    Patient ID: Taylor Ryan, male    DOB: January 04, 1981, 40 y.o.   MRN: [id number]  HPI Chief Complaint  Patient presents with   medication follow up    Needs Hepatic function panel. Follow up on Truvada   He is taking Truvada for PrEP daily. Started on this medication in 07/2020. Due for 3 month HIV testing to continue on medication. He and his partner are in a monogamous relationship. Declines additional STD testing.   Followed by GI for elevated LFTs. Needs liver function checked today per GI.   Denies and concerns.     Review of Systems Pertinent positives and negatives in the history of present illness.     Objective:   Physical Exam BP 122/88 (BP Location: Right Arm, Patient Position: Sitting)   Pulse 100   Ht 5\' 10"  (1.778 m)   Wt 230 lb 12.8 oz (104.7 kg)   SpO2 97%   BMI 33.12 kg/m   Alert and oriented and in no acute distress. Respirations unlabored.       Assessment & Plan:  High risk sexual behavior, unspecified type - Plan: RPR, HIV Antibody (routine testing w rflx)  Elevated ALT measurement - Plan: Comprehensive metabolic panel  Medication management - Plan: RPR, HIV Antibody (routine testing w rflx)  Need for COVID-19 vaccine - Plan:  Needs flu shot - Plan: Flu Vaccine QUAD 6+ mos PF IM (Fluarix Quad PF)  Doing well on Truvada for PrEP. Follow up and refill pending labs.  If his LFTs are elevated, he will follow up with GI.

## 2021-08-04 ENCOUNTER — Encounter: Payer: Self-pay | Admitting: Family Medicine

## 2021-08-04 ENCOUNTER — Other Ambulatory Visit: Payer: Self-pay | Admitting: Family Medicine

## 2021-08-04 DIAGNOSIS — Z7251 High risk heterosexual behavior: Secondary | ICD-10-CM

## 2021-08-04 LAB — RPR: RPR Ser Ql: NONREACTIVE

## 2021-08-04 LAB — HIV ANTIBODY (ROUTINE TESTING W REFLEX): HIV Screen 4th Generation wRfx: NONREACTIVE

## 2021-08-04 LAB — COMPREHENSIVE METABOLIC PANEL
ALT: 121 IU/L — ABNORMAL HIGH (ref 0–44)
AST: 62 IU/L — ABNORMAL HIGH (ref 0–40)
Albumin/Globulin Ratio: 1.8 (ref 1.2–2.2)
Albumin: 4.5 g/dL (ref 4.0–5.0)
Alkaline Phosphatase: 146 IU/L — ABNORMAL HIGH (ref 44–121)
BUN/Creatinine Ratio: 11 (ref 9–20)
BUN: 11 mg/dL (ref 6–24)
Bilirubin Total: 0.9 mg/dL (ref 0.0–1.2)
CO2: 24 mmol/L (ref 20–29)
Calcium: 9.6 mg/dL (ref 8.7–10.2)
Chloride: 100 mmol/L (ref 96–106)
Creatinine, Ser: 1.04 mg/dL (ref 0.76–1.27)
Globulin, Total: 2.5 g/dL (ref 1.5–4.5)
Glucose: 97 mg/dL (ref 70–99)
Potassium: 4.2 mmol/L (ref 3.5–5.2)
Sodium: 137 mmol/L (ref 134–144)
Total Protein: 7 g/dL (ref 6.0–8.5)
eGFR: 93 mL/min/{1.73_m2} (ref >59)

## 2021-08-04 MED ORDER — EMTRICITABINE-TENOFOVIR DF 200-300 MG PO TABS: ORAL_TABLET | ORAL | 0 refills | Status: DC

## 2021-08-04 NOTE — Progress Notes (Signed)
Please make sure he knows to follow up with his GI about the elevated liver function tests.

## 2021-08-04 NOTE — Progress Notes (Signed)
Our mutual patient, Mr Baudoin, has elevated liver function tests again and I reviewed your note stating he should follow up with you for further evaluation if this happened. Thank you.  Hetty Blend, NP-C

## 2021-08-05 ENCOUNTER — Other Ambulatory Visit: Payer: Self-pay

## 2021-08-05 DIAGNOSIS — R7989 Other specified abnormal findings of blood chemistry: Secondary | ICD-10-CM

## 2021-08-05 DIAGNOSIS — K838 Other specified diseases of biliary tract: Secondary | ICD-10-CM

## 2021-08-13 ENCOUNTER — Other Ambulatory Visit: Payer: Self-pay | Admitting: Gastroenterology

## 2021-08-13 ENCOUNTER — Ambulatory Visit (HOSPITAL_COMMUNITY)
Admission: RE | Admit: 2021-08-13 | Discharge: 2021-08-13 | Disposition: A | Discharge status: Home / Self Care | Payer: BC Managed Care – PPO | Source: Ambulatory Visit | Attending: Gastroenterology | Admitting: Gastroenterology

## 2021-08-13 DIAGNOSIS — K838 Other specified diseases of biliary tract: Secondary | ICD-10-CM | POA: Insufficient documentation

## 2021-08-13 DIAGNOSIS — R7989 Other specified abnormal findings of blood chemistry: Secondary | ICD-10-CM | POA: Diagnosis present

## 2021-08-13 MED ORDER — GADOBUTROL 1 MMOL/ML IV SOLN
10.0000 mL | Freq: Once | INTRAVENOUS | Status: AC | PRN
  Administered 2021-08-13: 10 mL via INTRAVENOUS

## 2021-08-17 ENCOUNTER — Other Ambulatory Visit (INDEPENDENT_AMBULATORY_CARE_PROVIDER_SITE_OTHER): Payer: BC Managed Care – PPO

## 2021-08-17 ENCOUNTER — Other Ambulatory Visit: Payer: Self-pay

## 2021-08-17 DIAGNOSIS — R7989 Other specified abnormal findings of blood chemistry: Secondary | ICD-10-CM

## 2021-08-17 DIAGNOSIS — K838 Other specified diseases of biliary tract: Secondary | ICD-10-CM

## 2021-08-17 LAB — COMPREHENSIVE METABOLIC PANEL
ALT: 16 U/L (ref 0–53)
AST: 11 U/L (ref 0–37)
Albumin: 4.3 g/dL (ref 3.5–5.2)
Alkaline Phosphatase: 64 U/L (ref 39–117)
BUN: 11 mg/dL (ref 6–23)
CO2: 28 mEq/L (ref 19–32)
Calcium: 9.3 mg/dL (ref 8.4–10.5)
Chloride: 104 mEq/L (ref 96–112)
Creatinine, Ser: 0.88 mg/dL (ref 0.40–1.50)
GFR: 106.45 mL/min (ref >60.00)
Glucose, Bld: 96 mg/dL (ref 70–99)
Potassium: 4.1 mEq/L (ref 3.5–5.1)
Sodium: 138 mEq/L (ref 135–145)
Total Bilirubin: 0.6 mg/dL (ref 0.2–1.2)
Total Protein: 6.7 g/dL (ref 6.0–8.3)

## 2021-08-17 LAB — CBC WITH DIFFERENTIAL/PLATELET
Basophils Absolute: 0.1 10*3/uL (ref 0.0–0.1)
Basophils Relative: 1.4 % (ref 0.0–3.0)
Eosinophils Absolute: 0.2 10*3/uL (ref 0.0–0.7)
Eosinophils Relative: 2.8 % (ref 0.0–5.0)
HCT: 42 % (ref 39.0–52.0)
Hemoglobin: 14.4 g/dL (ref 13.0–17.0)
Lymphocytes Relative: 36.2 % (ref 12.0–46.0)
Lymphs Abs: 2.6 10*3/uL (ref 0.7–4.0)
MCHC: 34.3 g/dL (ref 30.0–36.0)
MCV: 95.4 fl (ref 78.0–100.0)
Monocytes Absolute: 0.4 10*3/uL (ref 0.1–1.0)
Monocytes Relative: 6.1 % (ref 3.0–12.0)
Neutro Abs: 3.8 10*3/uL (ref 1.4–7.7)
Neutrophils Relative %: 53.5 % (ref 43.0–77.0)
Platelets: 211 10*3/uL (ref 150.0–400.0)
RBC: 4.41 Mil/uL (ref 4.22–5.81)
RDW: 13.2 % (ref 11.5–15.5)
WBC: 7.1 10*3/uL (ref 4.0–10.5)

## 2021-08-17 LAB — IBC + FERRITIN
Ferritin: 149.5 ng/mL (ref 22.0–322.0)
Iron: 98 ug/dL (ref 42–165)
Saturation Ratios: 30 % (ref 20.0–50.0)
TIBC: 326.2 ug/dL (ref 250.0–450.0)
Transferrin: 233 mg/dL (ref 212.0–360.0)

## 2021-08-17 LAB — PROTIME-INR
INR: 1 ratio (ref 0.8–1.0)
Prothrombin Time: 11.4 s (ref 9.6–13.1)

## 2021-08-17 NOTE — Addendum Note (Signed)
Addended by: Trellis Paganini D on: 08/17/2021 02:08 PM   Modules accepted: Orders

## 2021-08-24 LAB — ALPHA-1-ANTITRYPSIN: A-1 Antitrypsin, Ser: 155 mg/dL (ref 83–199)

## 2021-08-24 LAB — HEPATITIS A ANTIBODY, TOTAL: Hepatitis A AB,Total: NONREACTIVE

## 2021-08-24 LAB — IGA: Immunoglobulin A: 205 mg/dL (ref 47–310)

## 2021-08-24 LAB — MITOCHONDRIAL ANTIBODIES: Mitochondrial M2 Ab, IgG: <=20 U (ref <=20.0)

## 2021-08-24 LAB — TISSUE TRANSGLUTAMINASE, IGA: (tTG) Ab, IgA: <1 U/mL

## 2021-08-24 LAB — ANTI-SMOOTH MUSCLE ANTIBODY, IGG: Actin (Smooth Muscle) Antibody (IGG): 24 U — ABNORMAL HIGH (ref <20)

## 2021-08-24 LAB — ANA: Anti Nuclear Antibody (ANA): NEGATIVE

## 2021-08-24 LAB — CERULOPLASMIN: Ceruloplasmin: 22 mg/dL (ref 18–36)

## 2021-08-26 ENCOUNTER — Other Ambulatory Visit: Payer: Self-pay

## 2021-08-26 DIAGNOSIS — R7989 Other specified abnormal findings of blood chemistry: Secondary | ICD-10-CM

## 2021-09-22 ENCOUNTER — Ambulatory Visit: Payer: BC Managed Care – PPO | Admitting: Gastroenterology

## 2021-10-21 ENCOUNTER — Telehealth: Payer: Self-pay | Admitting: Family Medicine

## 2021-10-21 DIAGNOSIS — Z7251 High risk heterosexual behavior: Secondary | ICD-10-CM

## 2021-10-21 MED ORDER — EMTRICITABINE-TENOFOVIR DF 200-300 MG PO TABS: ORAL_TABLET | ORAL | 0 refills | Status: DC

## 2021-10-21 NOTE — Telephone Encounter (Signed)
[facility] sent refill request for truvada please send to the [facility], [place] - [phone]

## 2022-01-25 ENCOUNTER — Other Ambulatory Visit: Payer: Self-pay | Admitting: Family Medicine

## 2022-01-25 DIAGNOSIS — Z7251 High risk heterosexual behavior: Secondary | ICD-10-CM

## 2022-01-25 NOTE — Telephone Encounter (Signed)
[facility]. Not sure where you stand on refills of this med. Please advise how often you would like to see pt for labs for future refills. Thanks [place] ?

## 2022-03-08 ENCOUNTER — Other Ambulatory Visit: Payer: Self-pay | Admitting: Physician Assistant

## 2022-03-08 DIAGNOSIS — Z7251 High risk heterosexual behavior: Secondary | ICD-10-CM

## 2022-05-17 ENCOUNTER — Other Ambulatory Visit: Payer: Self-pay | Admitting: Physician Assistant

## 2022-05-17 DIAGNOSIS — Z7251 High risk heterosexual behavior: Secondary | ICD-10-CM

## 2022-05-17 NOTE — Telephone Encounter (Signed)
[facility]. Please advise [facility]

## 2022-05-17 NOTE — Telephone Encounter (Signed)
LVM for pt to call back to schedule a med check appointment . KH °

## 2022-05-20 NOTE — Telephone Encounter (Signed)
Lvm for pt to call and schedule and make a ppt. KH

## 2022-09-07 ENCOUNTER — Encounter: Payer: Self-pay | Admitting: Internal Medicine

## 2022-11-01 IMAGING — US US ABDOMEN LIMITED RUQ/ASCITES
1 series · 14 of 25 positions shown · non-contrast
Comparison: None.

CLINICAL DATA: Status post cholecystectomy. Now with elevated liver
enzymes.

EXAM:
ULTRASOUND ABDOMEN LIMITED RIGHT UPPER QUADRANT

[Series 1: us abdomen limited ruq/ascites · 0.23mm/px · 14 of 30 slices shown]
[im 1/30]
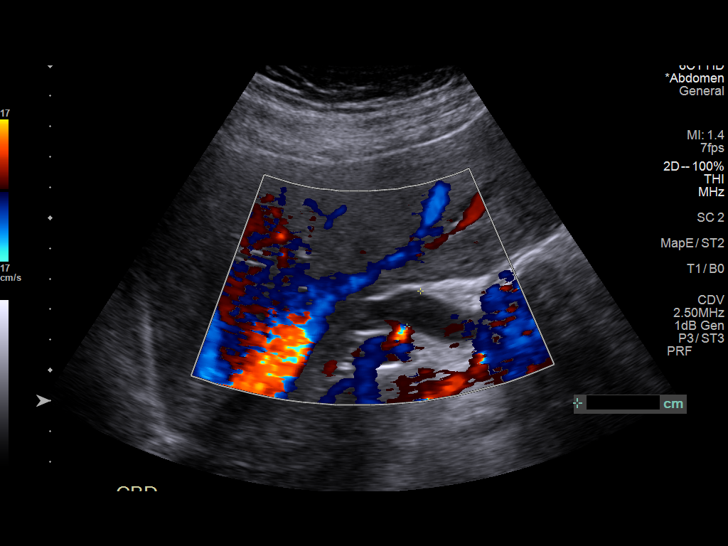
[im 3/30]
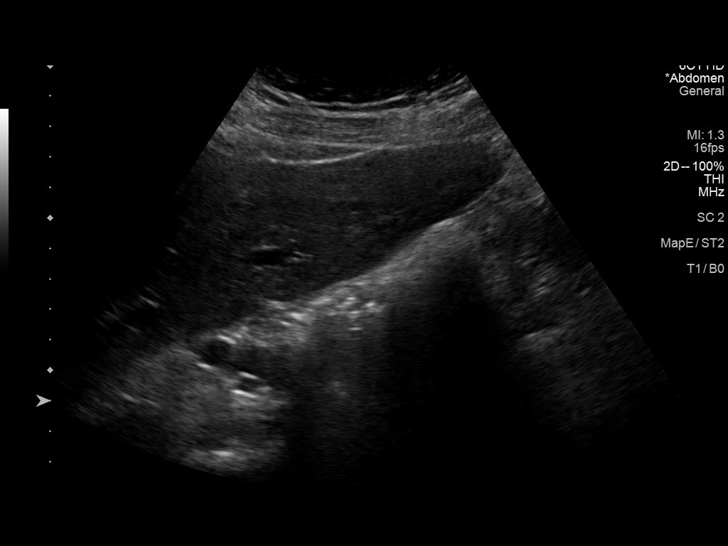
[im 5/30]
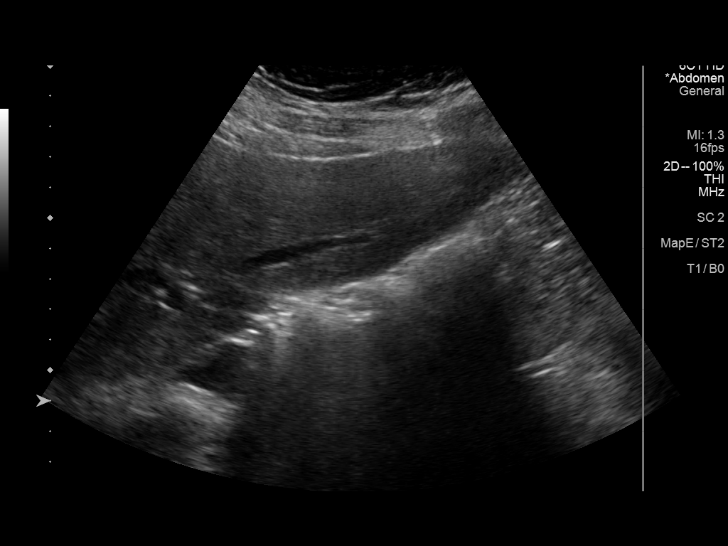
[im 8/30]
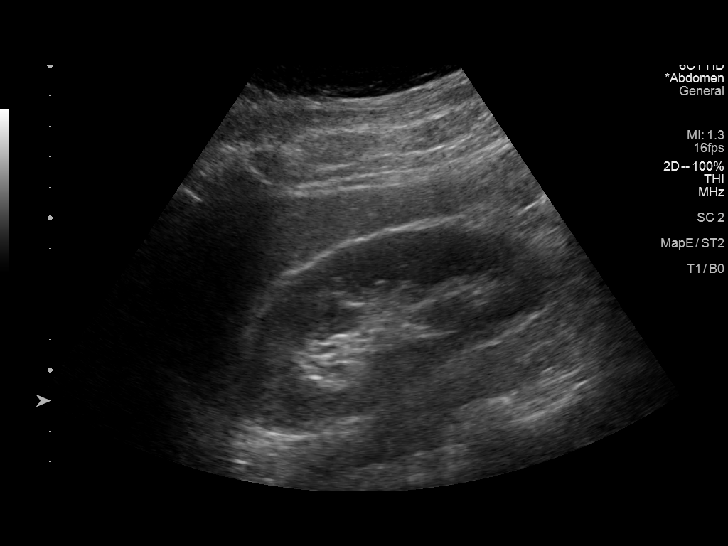
[im 10/30]
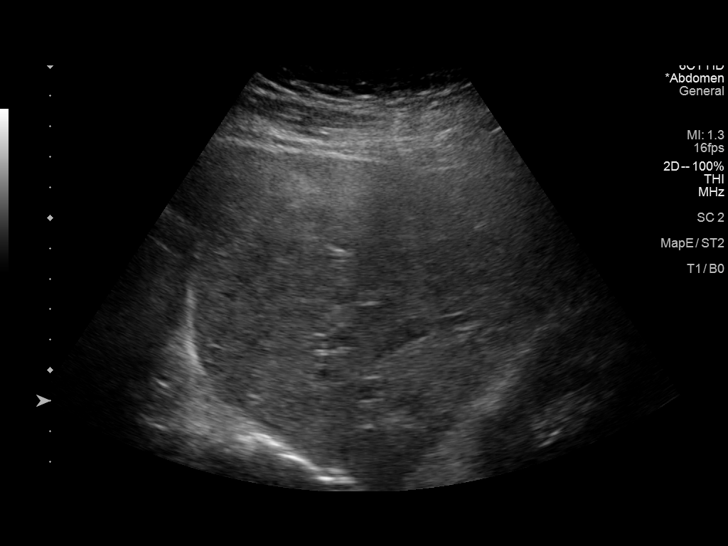
[im 11/30]
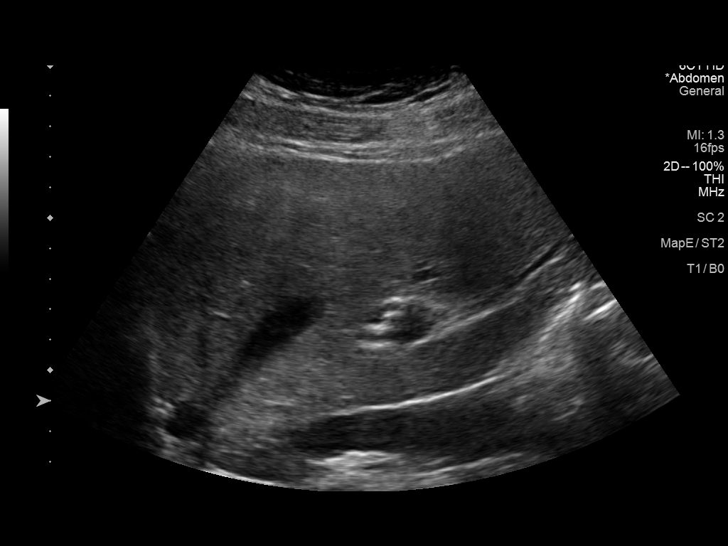
[im 14/30]
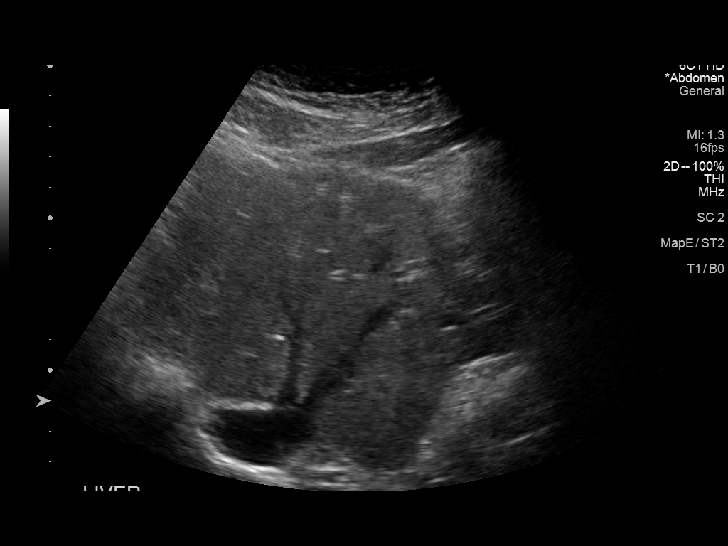
[im 16/30]
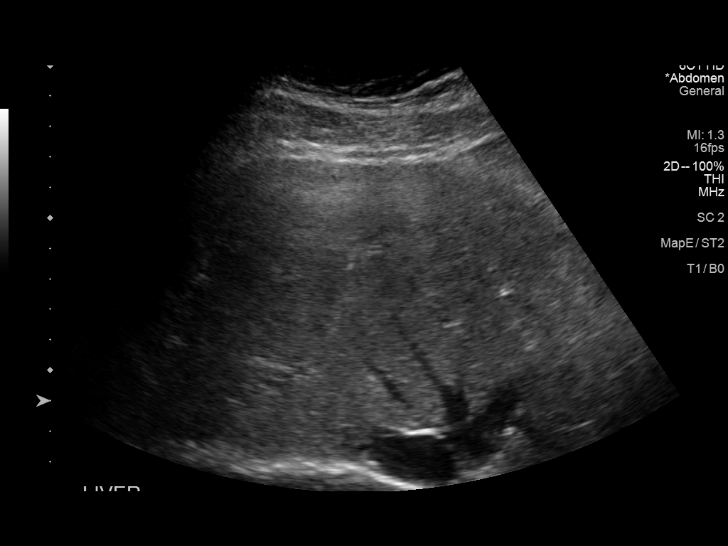
[im 19/30]
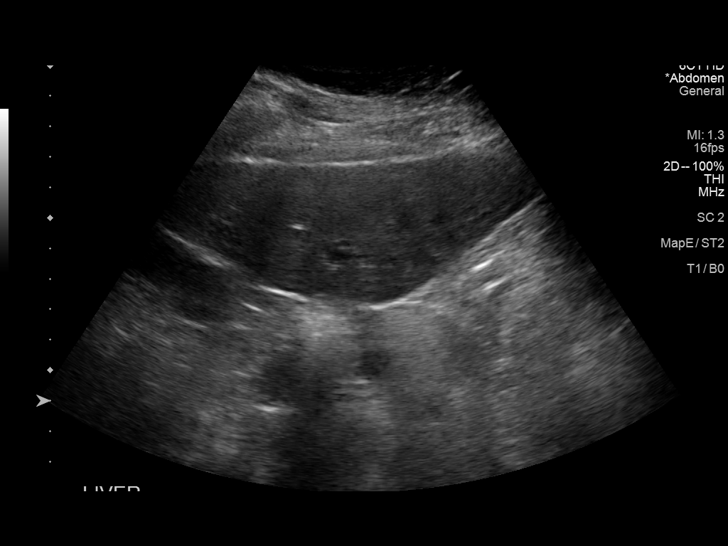
[im 20/30]
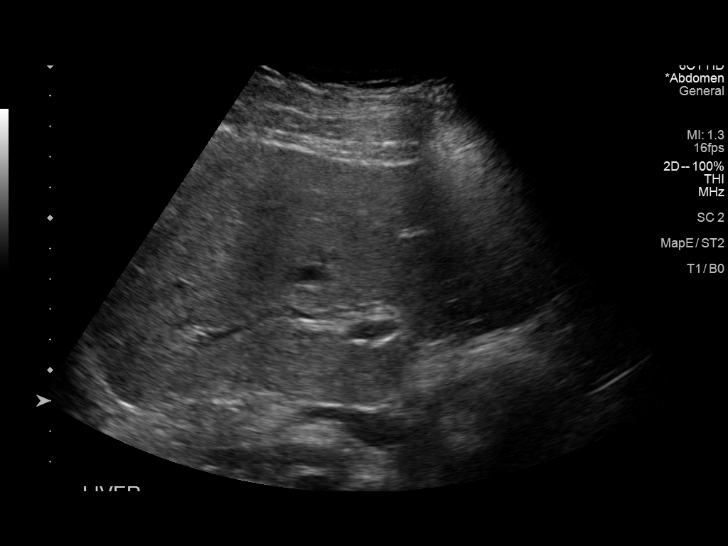
[im 22/30]
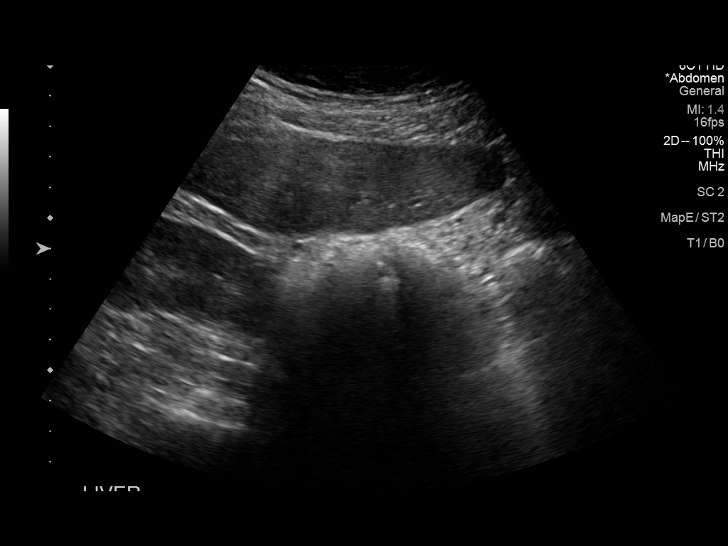
[im 25/30]
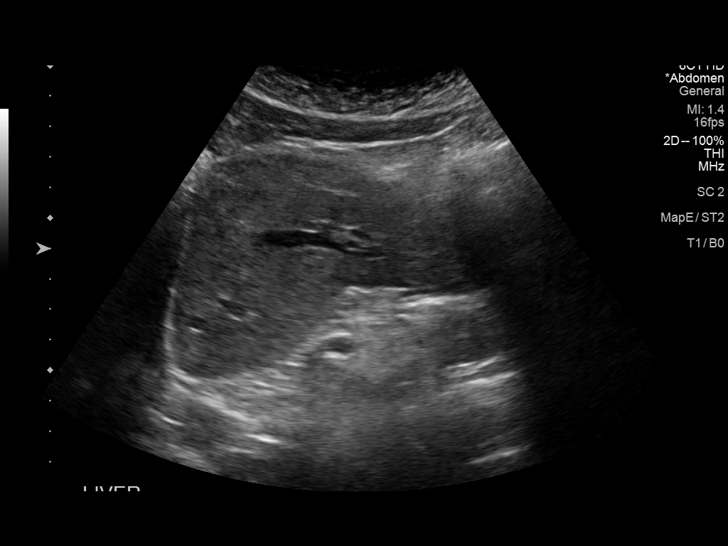
[im 27/30]
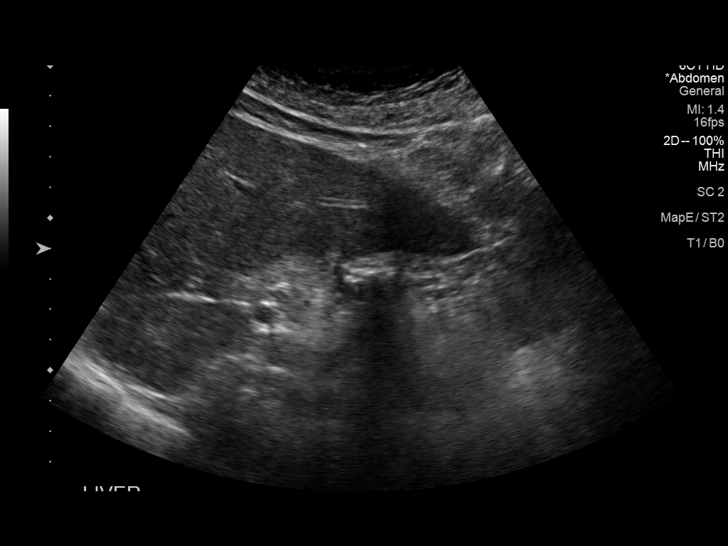
[im 30/30]
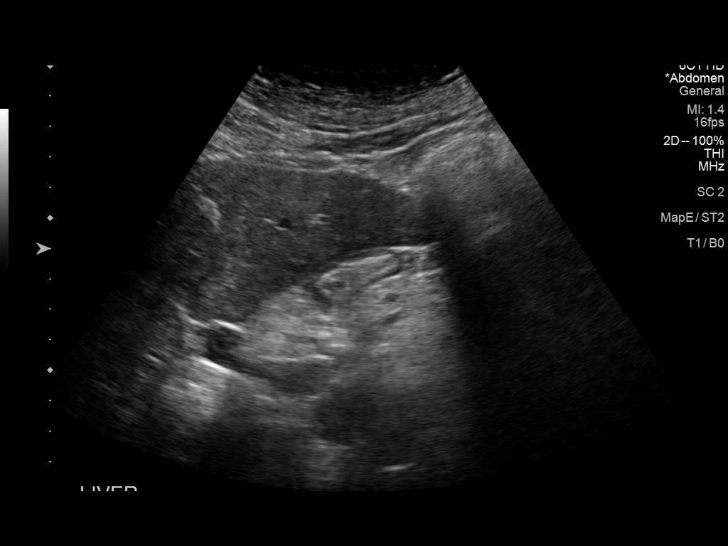

[14 of 25 positions shown; findings below may reference images not displayed]

FINDINGS: Gallbladder:

Status post cholecystectomy.

Common bile duct:

Diameter: Increase caliber of the common bile duct measures up to
12.5 mm.

Liver:

There is heterogeneous increased echogenicity of the liver. No focal
liver abnormality. Intrahepatic bile duct dilatation. Portal vein is
patent on color Doppler imaging with normal direction of blood flow
towards the liver.

Other: None.
IMPRESSION: 1. Status post cholecystectomy with increase caliber of the common
bile duct measuring 12.5 mm
2. Increased parenchymal echogenicity of the liver suggesting
hepatic steatosis.
